# Patient Record
Sex: Male | Born: 1955 | Hispanic: No | Marital: Single | Smoking: Current every day smoker
Health system: Southern US, Community
[De-identification: ages and names within clinical notes are randomized; demographics above are authoritative.]

## PROBLEM LIST (undated history)

## (undated) DIAGNOSIS — I10 Essential (primary) hypertension: Secondary | ICD-10-CM

## (undated) DIAGNOSIS — E119 Type 2 diabetes mellitus without complications: Secondary | ICD-10-CM

## (undated) HISTORY — PX: CHOLECYSTECTOMY: SHX55

## (undated) HISTORY — PX: OTHER SURGICAL HISTORY: SHX169

---

## 2019-04-23 ENCOUNTER — Encounter (HOSPITAL_COMMUNITY): Payer: Self-pay

## 2019-04-23 ENCOUNTER — Emergency Department (HOSPITAL_COMMUNITY)
Admission: EM | Admit: 2019-04-23 | Discharge: 2019-04-23 | Disposition: A | Discharge status: Home / Self Care | Payer: Medicare PPO | Attending: Emergency Medicine | Admitting: Emergency Medicine

## 2019-04-23 DIAGNOSIS — F17210 Nicotine dependence, cigarettes, uncomplicated: Secondary | ICD-10-CM | POA: Insufficient documentation

## 2019-04-23 DIAGNOSIS — E1165 Type 2 diabetes mellitus with hyperglycemia: Secondary | ICD-10-CM | POA: Insufficient documentation

## 2019-04-23 DIAGNOSIS — R739 Hyperglycemia, unspecified: Secondary | ICD-10-CM

## 2019-04-23 HISTORY — DX: Type 2 diabetes mellitus without complications: E11.9

## 2019-04-23 LAB — CBG MONITORING, ED
Glucose-Capillary: 269 mg/dL — ABNORMAL HIGH (ref 70–99)
Glucose-Capillary: 296 mg/dL — ABNORMAL HIGH (ref 70–99)

## 2019-04-23 MED ORDER — ALOGLIPTIN BENZOATE 25 MG PO TABS: 12.5000 | ORAL_TABLET | Freq: Every day | ORAL | 0 refills | Status: DC

## 2019-04-23 MED ORDER — ALOGLIPTIN BENZOATE 25 MG PO TABS: 12.5000 mg | ORAL_TABLET | Freq: Every day | ORAL | 0 refills | Status: AC

## 2019-04-23 NOTE — Discharge Instructions (Signed)
Start back on your sugar pill.  Your blood sugar here is in the 200s.  Make sure you eat lunch.  If you start feeling dizzy, lightheaded or not well make sure you check your blood sugar.  [facility] does not arrive.

## 2019-04-23 NOTE — ED Notes (Addendum)
BLOOD SUGAR 269

## 2019-04-23 NOTE — ED Triage Notes (Addendum)
Arrived by EMS from assisted living [facility] hyperglycemia asymptomatic glucose 421. Patient A& OX4, ambulates w/o assistance. Patient reports he took Lantus last night and another dose of Lantus this morning because his blood sugar was elevated .

## 2019-04-23 NOTE — ED Provider Notes (Signed)
[facility]-EMERGENCY DEPT Provider Note   CSN: [id number] Arrival date & time: 04/23/19  1234    History   Chief Complaint Chief Complaint  Patient presents with  . Hyperglycemia    HPI Harvie HeckBradley Fullman is a 63 y.o. male.     The history is provided by the patient.  Hyperglycemia  Blood sugar level PTA:  >400 Severity:  Moderate Onset quality:  Gradual Duration:  4 days Timing:  Constant Progression:  Worsening Diabetes status:  Controlled with oral medications and controlled with insulin Current diabetic therapy:  Insulin 12units at night and 1/2 tab of agliptin Time since last antidiabetic medication: took 12units of insulin today at 6 am and then again at 10am when sugar was high. Context comment:  Ran out of pill 4-5 days ago and waiting for it to come in the mail Ineffective treatments:  None tried Associated symptoms: no abdominal pain, no altered mental status, no blurred vision, no chest pain, no confusion, no dehydration, no dizziness, no dysuria, no increased thirst, no malaise, no nausea, no polyuria, no shortness of breath, no vomiting, no weakness and no weight change   Risk factors: obesity   Risk factors comment:  DM   Past Medical History:  Diagnosis Date  . Diabetes mellitus without complication (HCC)     There are no active problems to display for this patient.   History reviewed. No pertinent surgical history.      Home Medications    Prior to Admission medications   Not on File    Family History History reviewed. No pertinent family history.  Social History Social History   Tobacco Use  . Smoking status: Current Every Day Smoker    Packs/day: 1.00    Types: Cigarettes  . Smokeless tobacco: Never Used  Substance Use Topics  . Alcohol use: Not Currently  . Drug use: Not on file     Allergies   Glipizide   Review of Systems Review of Systems  Eyes: Negative for blurred vision.  Respiratory: Negative  for shortness of breath.   Cardiovascular: Negative for chest pain.  Gastrointestinal: Negative for abdominal pain, nausea and vomiting.  Endocrine: Negative for polydipsia and polyuria.  Genitourinary: Negative for dysuria.  Neurological: Negative for dizziness and weakness.  Psychiatric/Behavioral: Negative for confusion.  All other systems reviewed and are negative.    Physical Exam Updated Vital Signs BP (!) 169/87 (BP Location: Left Arm)   Pulse 74   Temp 98 F (36.7 C) (Oral)   Resp 17   Ht 5\' 3"  (1.6 m)   Wt 122.5 kg   SpO2 99%   BMI 47.83 kg/m   Physical Exam Vitals signs and nursing note reviewed.  Constitutional:      General: He is not in acute distress.    Appearance: He is well-developed.  HENT:     Head: Normocephalic and atraumatic.  Eyes:     Conjunctiva/sclera: Conjunctivae normal.     Pupils: Pupils are equal, round, and reactive to light.  Neck:     Musculoskeletal: Normal range of motion and neck supple.  Cardiovascular:     Rate and Rhythm: Normal rate and regular rhythm.     Heart sounds: No murmur.  Pulmonary:     Effort: Pulmonary effort is normal. No respiratory distress.     Breath sounds: Normal breath sounds. No wheezing or rales.  Abdominal:     General: There is no distension.     Palpations: Abdomen is  soft.     Tenderness: There is no abdominal tenderness. There is no guarding or rebound.  Musculoskeletal: Normal range of motion.        General: No tenderness.     Comments: Well healing left great toe stump  Skin:    General: Skin is warm and dry.     Findings: No erythema or rash.  Neurological:     General: No focal deficit present.     Mental Status: He is alert and oriented to person, place, and time. Mental status is at baseline.  Psychiatric:        Mood and Affect: Mood normal.        Behavior: Behavior normal.        Thought Content: Thought content normal.      ED Treatments / Results  Labs (all labs ordered are  listed, but only abnormal results are displayed) Labs Reviewed  CBG MONITORING, ED - Abnormal; Notable for the following components:      Result Value   Glucose-Capillary 296 (*)    All other components within normal limits  CBG MONITORING, ED - Abnormal; Notable for the following components:   Glucose-Capillary 269 (*)    All other components within normal limits    EKG None  Radiology No results found.  Procedures Procedures (including critical care time)  Medications Ordered in ED Medications - No data to display   Initial Impression / Assessment and Plan / ED Course  I have reviewed the triage vital signs and the nursing notes.  Pertinent labs & imaging results that were available during my care of the patient were reviewed by me and considered in my medical decision making (see chart for details).       63 year old male presenting today because of hyperglycemia.  Patient has been out of his oral medication for the last 4 to 5 days and so today when he woke up and his sugar was elevated he took additional insulin 12 units around 6 AM.  He ate breakfast around 930 and it was still elevated so he took another dose of 12 units at 10:00.  Patient has no complaints at this time.  He is having no symptoms.  He checked his blood sugar it was 421.  Here patient's blood sugar is 290 and 269.  He has not eaten lunch yet.  Patient given a prescription for the medication he is out of and will return if he becomes symptomatic.  Final Clinical Impressions(s) / ED Diagnoses   Final diagnoses:  Hyperglycemia    ED Discharge Orders         Ordered    Alogliptin Benzoate 25 MG TABS  Daily     04/23/19 1341           Gwyneth Sprout, MD 04/23/19 1341

## 2019-04-23 NOTE — ED Notes (Signed)
Bed: IT25 Expected date:  Expected time:  Means of arrival:  Comments: 63 yo hyperglycemia

## 2019-07-23 ENCOUNTER — Emergency Department (HOSPITAL_COMMUNITY)
Admission: EM | Admit: 2019-07-23 | Discharge: 2019-07-23 | Disposition: A | Discharge status: Home / Self Care | Payer: Medicare PPO | Attending: Emergency Medicine | Admitting: Emergency Medicine

## 2019-07-23 ENCOUNTER — Encounter (HOSPITAL_COMMUNITY): Payer: Self-pay

## 2019-07-23 ENCOUNTER — Other Ambulatory Visit: Payer: Self-pay

## 2019-07-23 ENCOUNTER — Emergency Department (HOSPITAL_COMMUNITY): Payer: Medicare PPO

## 2019-07-23 DIAGNOSIS — F17210 Nicotine dependence, cigarettes, uncomplicated: Secondary | ICD-10-CM | POA: Insufficient documentation

## 2019-07-23 DIAGNOSIS — E119 Type 2 diabetes mellitus without complications: Secondary | ICD-10-CM | POA: Insufficient documentation

## 2019-07-23 DIAGNOSIS — B349 Viral infection, unspecified: Secondary | ICD-10-CM | POA: Diagnosis not present

## 2019-07-23 DIAGNOSIS — Z20828 Contact with and (suspected) exposure to other viral communicable diseases: Secondary | ICD-10-CM | POA: Diagnosis not present

## 2019-07-23 DIAGNOSIS — R0602 Shortness of breath: Secondary | ICD-10-CM | POA: Diagnosis present

## 2019-07-23 LAB — BRAIN NATRIURETIC PEPTIDE: B Natriuretic Peptide: 219.4 pg/mL — ABNORMAL HIGH (ref 0.0–100.0)

## 2019-07-23 LAB — BASIC METABOLIC PANEL
Anion gap: 8 (ref 5–15)
BUN: 26 mg/dL — ABNORMAL HIGH (ref 8–23)
CO2: 26 mmol/L (ref 22–32)
Calcium: 8.6 mg/dL — ABNORMAL LOW (ref 8.9–10.3)
Chloride: 97 mmol/L — ABNORMAL LOW (ref 98–111)
Creatinine, Ser: 1.84 mg/dL — ABNORMAL HIGH (ref 0.61–1.24)
GFR calc Af Amer: 44 mL/min — ABNORMAL LOW (ref >60)
GFR calc non Af Amer: 38 mL/min — ABNORMAL LOW (ref >60)
Glucose, Bld: 299 mg/dL — ABNORMAL HIGH (ref 70–99)
Potassium: 4.4 mmol/L (ref 3.5–5.1)
Sodium: 131 mmol/L — ABNORMAL LOW (ref 135–145)

## 2019-07-23 LAB — SARS CORONAVIRUS 2 (TAT 6-24 HRS): SARS Coronavirus 2: NEGATIVE

## 2019-07-23 MED ORDER — IBUPROFEN 200 MG PO TABS
400.0000 mg | ORAL_TABLET | Freq: Once | ORAL | Status: AC
  Administered 2019-07-23: 400 mg via ORAL
  Filled 2019-07-23: qty 2

## 2019-07-23 MED ORDER — BENZONATATE 100 MG PO CAPS
100.0000 mg | ORAL_CAPSULE | Freq: Once | ORAL | Status: AC
  Administered 2019-07-23: 14:00:00 100 mg via ORAL
  Filled 2019-07-23: qty 1

## 2019-07-23 MED ORDER — ACETAMINOPHEN 325 MG PO TABS
650.0000 mg | ORAL_TABLET | Freq: Once | ORAL | Status: DC
  Filled 2019-07-23: qty 2

## 2019-07-23 MED ORDER — ACETAMINOPHEN 500 MG PO TABS: 500.0000 mg | ORAL_TABLET | Freq: Four times a day (QID) | ORAL | 0 refills | Status: AC | PRN

## 2019-07-23 MED ORDER — BENZONATATE 100 MG PO CAPS: 100.0000 mg | ORAL_CAPSULE | Freq: Three times a day (TID) | ORAL | 0 refills | Status: AC

## 2019-07-23 MED FILL — BENZONATATE 100 MG CAPS: 100 | 7 days supply | Qty: 21 | Fill #0

## 2019-07-23 NOTE — ED Triage Notes (Signed)
EMS from homeless shelter, called out for SOB, headache, cough x 4 days. Facility sent Pt for testing due to requires negative Covid test for placement.  BP 124/9*0 HR 86 RR 16 Sp02 97 RA Temp 98.1

## 2019-07-23 NOTE — Discharge Instructions (Signed)
You have been screened for COVID-19.  Your result will be available in the next 24 hrs.  If tested positive, we will contact you.  Take tylenol as needed for pain.  Avoid advil or ibuprofen at this time as your kidney function is less than optimal.  Return to the ER if your condition worsen or if you have other concerns.

## 2019-07-23 NOTE — ED Provider Notes (Signed)
[facility]-EMERGENCY DEPT Provider Note   CSN: [id number] Arrival date & time: 07/23/19  1022     History   Chief Complaint Chief Complaint  Patient presents with  . Shortness of Breath  . Cough  . Headache    HPI Darius Bond is a 63 y.o. male.     The history is provided by the patient and medical records. No language interpreter was used.  Shortness of Breath Associated symptoms: cough and headaches   Cough Associated symptoms: headaches and shortness of breath   Headache Associated symptoms: cough      63 year old male with history of diabetes, tobacco abuse, homeless, brought here via EMS from homeless shelter for complaints of cough.  Patient report for the past 4 days he has had persistent cough occasionally productive with sputum, having throbbing headache, pain to his low back down to his legs, not feeling well.  He does not complain of any fever or chills, no runny nose sneezing sore throat.  He does complain of some shortness of breath but denies exertional chest pain.  No report of nausea vomiting diarrhea or abdominal pain.  No dysuria.  He denies any recent sick contact with anyone with COVID-19.  The facility want patient to be tested for COVID-19 in order for placement.  Patient denies any specific treatment tried at home.  He admits to tobacco use and occasional alcohol use.  No recent travel.  Past Medical History:  Diagnosis Date  . Diabetes mellitus without complication (HCC)     There are no active problems to display for this patient.   History reviewed. No pertinent surgical history.      Home Medications    Prior to Admission medications   Medication Sig Start Date End Date Taking? Authorizing Provider  Alogliptin Benzoate 25 MG TABS Take 12.5 mg by mouth daily. 04/23/19   Gwyneth SproutPlunkett, Whitney, MD    Family History History reviewed. No pertinent family history.  Social History Social History   Tobacco Use  .  Smoking status: Current Every Day Smoker    Packs/day: 1.00    Types: Cigarettes  . Smokeless tobacco: Never Used  Substance Use Topics  . Alcohol use: Not Currently  . Drug use: Not on file     Allergies   Glipizide   Review of Systems Review of Systems  Respiratory: Positive for cough and shortness of breath.   Neurological: Positive for headaches.  All other systems reviewed and are negative.    Physical Exam Updated Vital Signs BP (!) 146/81   Pulse 76   Temp 98.3 F (36.8 C) (Oral)   Resp 16   Ht 5\' 3"  (1.6 m)   Wt 120.2 kg   SpO2 99%   BMI 46.94 kg/m   Physical Exam Vitals signs and nursing note reviewed.  Constitutional:      General: He is not in acute distress.    Appearance: He is well-developed. He is obese.  HENT:     Head: Normocephalic and atraumatic.  Eyes:     Conjunctiva/sclera: Conjunctivae normal.  Neck:     Musculoskeletal: Normal range of motion and neck supple.     Comments: No nuchal rigidity Cardiovascular:     Rate and Rhythm: Normal rate and regular rhythm.     Pulses: Normal pulses.     Heart sounds: Normal heart sounds.  Pulmonary:     Effort: Pulmonary effort is normal.     Breath sounds: Normal breath sounds. No  wheezing, rhonchi or rales.  Abdominal:     Palpations: Abdomen is soft.     Tenderness: There is no abdominal tenderness.  Musculoskeletal:        General: Tenderness (Tenderness to lumbar and paralumbar spinal muscle on palpation negative straight leg raise.) present. No swelling.  Lymphadenopathy:     Cervical: No cervical adenopathy.  Skin:    Capillary Refill: Capillary refill takes less than 2 seconds.     Findings: No rash.  Neurological:     Mental Status: He is alert and oriented to person, place, and time.  Psychiatric:        Mood and Affect: Mood normal.      ED Treatments / Results  Labs (all labs ordered are listed, but only abnormal results are displayed) Labs Reviewed  BASIC METABOLIC  PANEL - Abnormal; Notable for the following components:      Result Value   Sodium 131 (*)    Chloride 97 (*)    Glucose, Bld 299 (*)    BUN 26 (*)    Creatinine, Ser 1.84 (*)    Calcium 8.6 (*)    GFR calc non Af Amer 38 (*)    GFR calc Af Amer 44 (*)    All other components within normal limits  BRAIN NATRIURETIC PEPTIDE - Abnormal; Notable for the following components:   B Natriuretic Peptide 219.4 (*)    All other components within normal limits  SARS CORONAVIRUS 2    EKG EKG Interpretation  Date/Time:  Friday July 23 2019 11:19:05 EDT Ventricular Rate:  77 PR Interval:    QRS Duration: 80 QT Interval:  388 QTC Calculation: 440 R Axis:   -54 Text Interpretation:  Sinus rhythm Prolonged PR interval Abnormal R-wave progression, late transition Inferior infarct, old Baseline wander in lead(s) V2 Abnormal ECG Confirmed by Gerhard MunchLockwood, Robert [phone]([id number]) on 07/23/2019 11:36:24 AM   Radiology Dg Chest Portable 1 View  Result Date: 07/23/2019 CLINICAL DATA:  EMS from homeless shelter, called out for SOB, headache, productive cough (brown and yellow sputum) x 4 days. Facility sent Pt for testing. Requires negative Covid test for placement. Pt has HTN. DM. Current smoker since age 63.*comment was truncated*cough EXAM: PORTABLE CHEST 1 VIEW COMPARISON:  None. FINDINGS: Normal mediastinum and cardiac silhouette. Normal pulmonary vasculature. No evidence of effusion, infiltrate, or pneumothorax. No acute bony abnormality. IMPRESSION: No acute cardiopulmonary process. Electronically Signed   By: Genevive BiStewart  Edmunds M.D.   On: 07/23/2019 11:42    Procedures Procedures (including critical care time)  Medications Ordered in ED Medications  benzonatate (TESSALON) capsule 100 mg (100 mg Oral Given 07/23/19 1335)  ibuprofen (ADVIL) tablet 400 mg (400 mg Oral Given 07/23/19 1335)     Initial Impression / Assessment and Plan / ED Course  I have reviewed the triage vital signs and the nursing  notes.  Pertinent labs & imaging results that were available during my care of the patient were reviewed by me and considered in my medical decision making (see chart for details).        BP (!) 156/83   Pulse 80   Temp 98.3 F (36.8 C) (Oral)   Resp (!) 26   Ht 5\' 3"  (1.6 m)   Wt 120.2 kg   SpO2 97%   BMI 46.94 kg/m    Final Clinical Impressions(s) / ED Diagnoses   Final diagnoses:  Viral illness    ED Discharge Orders         Ordered  benzonatate (TESSALON) 100 MG capsule  Every 8 hours     07/23/19 1416    acetaminophen (TYLENOL) 500 MG tablet  Every 6 hours PRN     07/23/19 1416         10:49 AM Patient here with cough shortness of breath, headache and body aches.  He is homeless and currently staying at home with seltzer.  He was sent here to be screened for COVID-19 for placement.  Patient otherwise well-appearing, no hypoxia, afebrile, vital signs stable.  His lung exam is unremarkable.  Will obtain screening labs.  12:42 PM Chest x-ray normal-appearing.  Elevated BNP of 219 however patient does not have any signs of overt CHF.  Evidence of AKI with BUN 26, creatinine 1.84 no prior values for comparison.  Patient is also hyperglycemic with a CBG of 299.  Normal anion gap.  COVID-19 test is currently pending.  Gaylord Seydel was evaluated in Emergency Department on 07/23/2019 for the symptoms described in the history of present illness. He was evaluated in the context of the global COVID-19 pandemic, which necessitated consideration that the patient might be at risk for infection with the SARS-CoV-2 virus that causes COVID-19. Institutional protocols and algorithms that pertain to the evaluation of patients at risk for COVID-19 are in a state of rapid change based on information released by regulatory bodies including the CDC and federal and state organizations. These policies and algorithms were followed during the patient's care in the ED.    Domenic Moras,  PA-C 07/23/19 1418    Carmin Muskrat, MD 07/26/19 [phone]

## 2019-07-28 ENCOUNTER — Emergency Department (HOSPITAL_COMMUNITY)
Admission: EM | Admit: 2019-07-28 | Discharge: 2019-07-28 | Disposition: A | Discharge status: Home / Self Care | Payer: Medicare PPO | Source: Home / Self Care | Attending: Emergency Medicine | Admitting: Emergency Medicine

## 2019-07-28 ENCOUNTER — Encounter (HOSPITAL_COMMUNITY): Payer: Self-pay

## 2019-07-28 ENCOUNTER — Inpatient Hospital Stay (HOSPITAL_COMMUNITY)
Admission: EM | Admit: 2019-07-28 | Discharge: 2019-08-01 | DRG: 617 | Disposition: A | Discharge status: Home / Self Care | Payer: Medicare PPO | Attending: Family Medicine | Admitting: Family Medicine

## 2019-07-28 ENCOUNTER — Other Ambulatory Visit: Payer: Self-pay

## 2019-07-28 ENCOUNTER — Emergency Department (HOSPITAL_COMMUNITY): Payer: Medicare PPO

## 2019-07-28 DIAGNOSIS — Z79899 Other long term (current) drug therapy: Secondary | ICD-10-CM | POA: Diagnosis not present

## 2019-07-28 DIAGNOSIS — E8881 Metabolic syndrome: Secondary | ICD-10-CM | POA: Diagnosis present

## 2019-07-28 DIAGNOSIS — I2510 Atherosclerotic heart disease of native coronary artery without angina pectoris: Secondary | ICD-10-CM | POA: Diagnosis present

## 2019-07-28 DIAGNOSIS — I440 Atrioventricular block, first degree: Secondary | ICD-10-CM | POA: Diagnosis present

## 2019-07-28 DIAGNOSIS — E1165 Type 2 diabetes mellitus with hyperglycemia: Secondary | ICD-10-CM | POA: Diagnosis present

## 2019-07-28 DIAGNOSIS — I10 Essential (primary) hypertension: Secondary | ICD-10-CM | POA: Diagnosis present

## 2019-07-28 DIAGNOSIS — E1140 Type 2 diabetes mellitus with diabetic neuropathy, unspecified: Secondary | ICD-10-CM | POA: Diagnosis present

## 2019-07-28 DIAGNOSIS — Z888 Allergy status to other drugs, medicaments and biological substances status: Secondary | ICD-10-CM

## 2019-07-28 DIAGNOSIS — I129 Hypertensive chronic kidney disease with stage 1 through stage 4 chronic kidney disease, or unspecified chronic kidney disease: Secondary | ICD-10-CM | POA: Diagnosis present

## 2019-07-28 DIAGNOSIS — Z6841 Body Mass Index (BMI) 40.0 and over, adult: Secondary | ICD-10-CM | POA: Diagnosis not present

## 2019-07-28 DIAGNOSIS — N183 Chronic kidney disease, stage 3 unspecified: Secondary | ICD-10-CM

## 2019-07-28 DIAGNOSIS — M86172 Other acute osteomyelitis, left ankle and foot: Secondary | ICD-10-CM | POA: Diagnosis present

## 2019-07-28 DIAGNOSIS — Z7982 Long term (current) use of aspirin: Secondary | ICD-10-CM

## 2019-07-28 DIAGNOSIS — E1169 Type 2 diabetes mellitus with other specified complication: Principal | ICD-10-CM | POA: Diagnosis present

## 2019-07-28 DIAGNOSIS — G4730 Sleep apnea, unspecified: Secondary | ICD-10-CM | POA: Diagnosis present

## 2019-07-28 DIAGNOSIS — Z794 Long term (current) use of insulin: Secondary | ICD-10-CM

## 2019-07-28 DIAGNOSIS — E1142 Type 2 diabetes mellitus with diabetic polyneuropathy: Secondary | ICD-10-CM | POA: Diagnosis present

## 2019-07-28 DIAGNOSIS — N179 Acute kidney failure, unspecified: Secondary | ICD-10-CM | POA: Diagnosis present

## 2019-07-28 DIAGNOSIS — M8618 Other acute osteomyelitis, other site: Secondary | ICD-10-CM

## 2019-07-28 DIAGNOSIS — E11621 Type 2 diabetes mellitus with foot ulcer: Secondary | ICD-10-CM | POA: Diagnosis present

## 2019-07-28 DIAGNOSIS — Z20828 Contact with and (suspected) exposure to other viral communicable diseases: Secondary | ICD-10-CM | POA: Diagnosis present

## 2019-07-28 DIAGNOSIS — E1122 Type 2 diabetes mellitus with diabetic chronic kidney disease: Secondary | ICD-10-CM | POA: Diagnosis present

## 2019-07-28 DIAGNOSIS — Z590 Homelessness: Secondary | ICD-10-CM

## 2019-07-28 DIAGNOSIS — E1121 Type 2 diabetes mellitus with diabetic nephropathy: Secondary | ICD-10-CM | POA: Diagnosis present

## 2019-07-28 DIAGNOSIS — F17210 Nicotine dependence, cigarettes, uncomplicated: Secondary | ICD-10-CM | POA: Diagnosis present

## 2019-07-28 DIAGNOSIS — M7989 Other specified soft tissue disorders: Secondary | ICD-10-CM

## 2019-07-28 DIAGNOSIS — I739 Peripheral vascular disease, unspecified: Secondary | ICD-10-CM | POA: Diagnosis present

## 2019-07-28 DIAGNOSIS — M869 Osteomyelitis, unspecified: Secondary | ICD-10-CM | POA: Diagnosis present

## 2019-07-28 DIAGNOSIS — E1151 Type 2 diabetes mellitus with diabetic peripheral angiopathy without gangrene: Secondary | ICD-10-CM | POA: Diagnosis present

## 2019-07-28 DIAGNOSIS — M79675 Pain in left toe(s): Secondary | ICD-10-CM | POA: Diagnosis present

## 2019-07-28 DIAGNOSIS — R0609 Other forms of dyspnea: Secondary | ICD-10-CM | POA: Diagnosis not present

## 2019-07-28 HISTORY — DX: Essential (primary) hypertension: I10

## 2019-07-28 LAB — CBC WITH DIFFERENTIAL/PLATELET
Abs Immature Granulocytes: 0.21 10*3/uL — ABNORMAL HIGH (ref 0.00–0.07)
Basophils Absolute: 0 10*3/uL (ref 0.0–0.1)
Basophils Relative: 0 %
Eosinophils Absolute: 1 10*3/uL — ABNORMAL HIGH (ref 0.0–0.5)
Eosinophils Relative: 7 %
HCT: 32.8 % — ABNORMAL LOW (ref 39.0–52.0)
Hemoglobin: 11.1 g/dL — ABNORMAL LOW (ref 13.0–17.0)
Immature Granulocytes: 2 %
Lymphocytes Relative: 12 %
Lymphs Abs: 1.7 10*3/uL (ref 0.7–4.0)
MCH: 27 pg (ref 26.0–34.0)
MCHC: 33.8 g/dL (ref 30.0–36.0)
MCV: 79.8 fL — ABNORMAL LOW (ref 80.0–100.0)
Monocytes Absolute: 1.5 10*3/uL — ABNORMAL HIGH (ref 0.1–1.0)
Monocytes Relative: 11 %
Neutro Abs: 9.7 10*3/uL — ABNORMAL HIGH (ref 1.7–7.7)
Neutrophils Relative %: 68 %
Platelets: 285 10*3/uL (ref 150–400)
RBC: 4.11 MIL/uL — ABNORMAL LOW (ref 4.22–5.81)
RDW: 13.6 % (ref 11.5–15.5)
WBC: 14.1 10*3/uL — ABNORMAL HIGH (ref 4.0–10.5)
nRBC: 0 % (ref 0.0–0.2)

## 2019-07-28 LAB — SARS CORONAVIRUS 2 (TAT 6-24 HRS): SARS Coronavirus 2: NEGATIVE

## 2019-07-28 LAB — BASIC METABOLIC PANEL
Anion gap: 9 (ref 5–15)
BUN: 26 mg/dL — ABNORMAL HIGH (ref 8–23)
CO2: 22 mmol/L (ref 22–32)
Calcium: 8.3 mg/dL — ABNORMAL LOW (ref 8.9–10.3)
Chloride: 100 mmol/L (ref 98–111)
Creatinine, Ser: 1.64 mg/dL — ABNORMAL HIGH (ref 0.61–1.24)
GFR calc Af Amer: 50 mL/min — ABNORMAL LOW (ref >60)
GFR calc non Af Amer: 44 mL/min — ABNORMAL LOW (ref >60)
Glucose, Bld: 274 mg/dL — ABNORMAL HIGH (ref 70–99)
Potassium: 4.4 mmol/L (ref 3.5–5.1)
Sodium: 131 mmol/L — ABNORMAL LOW (ref 135–145)

## 2019-07-28 LAB — LACTIC ACID, PLASMA: Lactic Acid, Venous: 0.8 mmol/L (ref 0.5–1.9)

## 2019-07-28 LAB — GLUCOSE, CAPILLARY: Glucose-Capillary: 272 mg/dL — ABNORMAL HIGH (ref 70–99)

## 2019-07-28 MED ORDER — METHOCARBAMOL 500 MG PO TABS
750.0000 mg | ORAL_TABLET | Freq: Two times a day (BID) | ORAL | Status: DC | PRN
  Administered 2019-07-28 – 2019-07-30 (×3): 750 mg via ORAL
  Filled 2019-07-28 (×3): qty 2

## 2019-07-28 MED ORDER — ENOXAPARIN SODIUM 40 MG/0.4ML ~~LOC~~ SOLN
40.0000 mg | SUBCUTANEOUS | Status: DC
  Administered 2019-07-28 – 2019-07-31 (×4): 40 mg via SUBCUTANEOUS
  Filled 2019-07-28 (×4): qty 0.4

## 2019-07-28 MED ORDER — FLUOXETINE HCL 20 MG PO CAPS
60.0000 mg | ORAL_CAPSULE | Freq: Every day | ORAL | Status: DC
  Administered 2019-07-29 – 2019-08-01 (×4): 60 mg via ORAL
  Filled 2019-07-28 (×4): qty 3

## 2019-07-28 MED ORDER — SIMVASTATIN 20 MG PO TABS
20.0000 mg | ORAL_TABLET | Freq: Every day | ORAL | Status: DC
  Administered 2019-07-29 – 2019-08-01 (×4): 20 mg via ORAL
  Filled 2019-07-28 (×4): qty 1

## 2019-07-28 MED ORDER — ONDANSETRON HCL 4 MG/2ML IJ SOLN
4.0000 mg | Freq: Once | INTRAMUSCULAR | Status: AC
  Administered 2019-07-28: 14:00:00 4 mg via INTRAVENOUS
  Filled 2019-07-28: qty 2

## 2019-07-28 MED ORDER — TAMSULOSIN HCL 0.4 MG PO CAPS
0.4000 mg | ORAL_CAPSULE | Freq: Every day | ORAL | Status: DC
  Administered 2019-07-28 – 2019-07-31 (×4): 0.4 mg via ORAL
  Filled 2019-07-28 (×4): qty 1

## 2019-07-28 MED ORDER — VANCOMYCIN HCL 10 G IV SOLR
2000.0000 mg | Freq: Once | INTRAVENOUS | Status: AC
  Administered 2019-07-28: 2000 mg via INTRAVENOUS
  Filled 2019-07-28: qty 2000

## 2019-07-28 MED ORDER — TRAZODONE HCL 100 MG PO TABS
200.0000 mg | ORAL_TABLET | Freq: Every day | ORAL | Status: DC
  Administered 2019-07-28 – 2019-07-31 (×4): 200 mg via ORAL
  Filled 2019-07-28 (×4): qty 2

## 2019-07-28 MED ORDER — PIPERACILLIN-TAZOBACTAM 3.375 G IVPB 30 MIN
3.3750 g | Freq: Once | INTRAVENOUS | Status: AC
  Administered 2019-07-28: 14:00:00 3.375 g via INTRAVENOUS
  Filled 2019-07-28: qty 50

## 2019-07-28 MED ORDER — MORPHINE SULFATE (PF) 4 MG/ML IV SOLN
4.0000 mg | Freq: Once | INTRAVENOUS | Status: AC
  Administered 2019-07-28: 4 mg via INTRAVENOUS
  Filled 2019-07-28: qty 1

## 2019-07-28 MED ORDER — INSULIN ASPART 100 UNIT/ML ~~LOC~~ SOLN
0.0000 [IU] | Freq: Three times a day (TID) | SUBCUTANEOUS | Status: DC
  Administered 2019-07-29: 10:00:00 5 [IU] via SUBCUTANEOUS
  Administered 2019-07-29 – 2019-07-30 (×2): 1 [IU] via SUBCUTANEOUS
  Administered 2019-07-31 – 2019-08-01 (×4): 2 [IU] via SUBCUTANEOUS

## 2019-07-28 MED ORDER — ALOGLIPTIN BENZOATE 25 MG PO TABS: 12.5000 mg | ORAL_TABLET | Freq: Every day | ORAL | Status: DC

## 2019-07-28 MED ORDER — VANCOMYCIN HCL IN DEXTROSE 1-5 GM/200ML-% IV SOLN
1000.0000 mg | INTRAVENOUS | Status: DC
  Administered 2019-07-29 – 2019-07-31 (×2): 1000 mg via INTRAVENOUS
  Filled 2019-07-28 (×5): qty 200

## 2019-07-28 MED ORDER — PIPERACILLIN-TAZOBACTAM 3.375 G IVPB
3.3750 g | Freq: Three times a day (TID) | INTRAVENOUS | Status: DC
  Administered 2019-07-28 – 2019-08-01 (×11): 3.375 g via INTRAVENOUS
  Filled 2019-07-28 (×13): qty 50

## 2019-07-28 MED ORDER — ASPIRIN 81 MG PO CHEW
81.0000 mg | CHEWABLE_TABLET | Freq: Every day | ORAL | Status: DC
  Administered 2019-07-29 – 2019-08-01 (×3): 81 mg via ORAL
  Filled 2019-07-28 (×3): qty 1

## 2019-07-28 MED ORDER — LINAGLIPTIN 5 MG PO TABS
5.0000 mg | ORAL_TABLET | Freq: Every day | ORAL | Status: DC
  Administered 2019-07-29 – 2019-08-01 (×4): 5 mg via ORAL
  Filled 2019-07-28 (×3): qty 1

## 2019-07-28 MED ORDER — POLYVINYL ALCOHOL 1.4 % OP SOLN: 1.0000 [drp] | OPHTHALMIC | Status: DC | PRN

## 2019-07-28 MED ORDER — SODIUM CHLORIDE 0.9 % IV SOLN
INTRAVENOUS | Status: DC
  Administered 2019-07-28 – 2019-07-30 (×2): via INTRAVENOUS

## 2019-07-28 MED ORDER — BENZONATATE 100 MG PO CAPS
100.0000 mg | ORAL_CAPSULE | Freq: Three times a day (TID) | ORAL | Status: DC | PRN
  Administered 2019-07-30: 08:00:00 100 mg via ORAL
  Filled 2019-07-28: qty 1

## 2019-07-28 MED ORDER — INSULIN ASPART 100 UNIT/ML ~~LOC~~ SOLN
0.0000 [IU] | Freq: Every day | SUBCUTANEOUS | Status: DC
  Administered 2019-07-28: 23:00:00 3 [IU] via SUBCUTANEOUS

## 2019-07-28 MED ORDER — ACETAMINOPHEN 500 MG PO TABS
500.0000 mg | ORAL_TABLET | Freq: Four times a day (QID) | ORAL | Status: DC | PRN
  Administered 2019-07-28 – 2019-07-30 (×4): 500 mg via ORAL
  Filled 2019-07-28 (×4): qty 1

## 2019-07-28 MED ORDER — INSULIN GLARGINE 100 UNIT/ML ~~LOC~~ SOLN
12.0000 [IU] | Freq: Every day | SUBCUTANEOUS | Status: DC
  Administered 2019-07-28: 23:00:00 12 [IU] via SUBCUTANEOUS
  Filled 2019-07-28: qty 0.12

## 2019-07-28 MED ORDER — LATANOPROST 0.005 % OP SOLN
1.0000 [drp] | Freq: Every day | OPHTHALMIC | Status: DC
  Administered 2019-07-28 – 2019-07-31 (×4): 1 [drp] via OPHTHALMIC
  Filled 2019-07-28: qty 2.5

## 2019-07-28 MED ORDER — HYDROXYZINE HCL 25 MG PO TABS
25.0000 mg | ORAL_TABLET | Freq: Two times a day (BID) | ORAL | Status: DC
  Administered 2019-07-28 – 2019-08-01 (×8): 25 mg via ORAL
  Filled 2019-07-28 (×8): qty 1

## 2019-07-28 MED ORDER — CARVEDILOL 12.5 MG PO TABS
12.5000 mg | ORAL_TABLET | Freq: Two times a day (BID) | ORAL | Status: DC
  Administered 2019-07-28 – 2019-08-01 (×8): 12.5 mg via ORAL
  Filled 2019-07-28 (×8): qty 1

## 2019-07-28 MED ORDER — AMLODIPINE BESYLATE 5 MG PO TABS
5.0000 mg | ORAL_TABLET | Freq: Every day | ORAL | Status: DC
  Administered 2019-07-29 – 2019-07-30 (×2): 5 mg via ORAL
  Filled 2019-07-28 (×2): qty 1

## 2019-07-28 MED ORDER — MELATONIN 3 MG PO TABS
6.0000 mg | ORAL_TABLET | Freq: Every day | ORAL | Status: DC
  Administered 2019-07-28 – 2019-07-31 (×4): 6 mg via ORAL
  Filled 2019-07-28 (×5): qty 2

## 2019-07-28 NOTE — Progress Notes (Signed)
Left lower extremity venous duplex has been completed. Preliminary results can be found in CV Proc through chart review.  Results were given to Dr. Gilford Raid.   07/28/19 3:23 PM Darius Bond RVT

## 2019-07-28 NOTE — ED Notes (Signed)
ED TO INPATIENT HANDOFF REPORT  Name/Age/Gender Darius Bond 63 y.o. male  Code Status   Home/SNF/Other Home  Chief Complaint skin infection  Level of Care/Admitting Diagnosis ED Disposition    ED Disposition Condition Comment   Admit  [facility] [[id number]]  Level of Care: Med-Surg [16]  Covid Evaluation: Asymptomatic Screening Protocol (No Symptoms)  Diagnosis: Osteomyelitis (HCC) [161096][242991]  Admitting Physician: Kathlen ModyAKULA, VIJAYA [[id number]]  Attending Physician: Kathlen ModyAKULA, VIJAYA [[id number]]  Estimated length of stay: past midnight tomorrow  Certification:: I certify this patient will need inpatient services for at least 2 midnights  PT Class (Do Not Modify): Inpatient [101]  PT Acc Code (Do Not Modify): Private [1]       Medical History Past Medical History:  Diagnosis Date  . Diabetes mellitus without complication (HCC)   . Hypertension     Allergies Allergies  Allergen Reactions  . Glipizide Swelling    Tongue swelling    IV Location/Drains/Wounds Patient Lines/Drains/Airways Status   Active Line/Drains/Airways    Name:   Placement date:   Placement time:   Site:   Days:   Peripheral IV 07/28/19 Left Forearm   07/28/19    1357    Forearm   less than 1          Labs/Imaging Results for orders placed or performed during the hospital encounter of 07/28/19 (from the past 48 hour(s))  Basic metabolic panel     Status: Abnormal   Collection Time: 07/28/19  1:25 PM  Result Value Ref Range   Sodium 131 (L) 135 - 145 mmol/L   Potassium 4.4 3.5 - 5.1 mmol/L   Chloride 100 98 - 111 mmol/L   CO2 22 22 - 32 mmol/L   Glucose, Bld 274 (H) 70 - 99 mg/dL   BUN 26 (H) 8 - 23 mg/dL   Creatinine, Ser 0.451.64 (H) 0.61 - 1.24 mg/dL   Calcium 8.3 (L) 8.9 - 10.3 mg/dL   GFR calc non Af Amer 44 (L) >60 mL/min   GFR calc Af Amer 50 (L) >60 mL/min   Anion gap 9 5 - 15    Comment: Performed at [facility], [address]  [place], [place] [address]  CBC with Differential/Platelet     Status: Abnormal (Preliminary result)   Collection Time: 07/28/19  1:25 PM  Result Value Ref Range   WBC 14.1 (H) 4.0 - 10.5 K/uL   RBC 4.11 (L) 4.22 - 5.81 MIL/uL   Hemoglobin 11.1 (L) 13.0 - 17.0 g/dL   HCT 19.132.8 (L) 47.839.0 - 29.552.0 %   MCV 79.8 (L) 80.0 - 100.0 fL   MCH 27.0 26.0 - 34.0 pg   MCHC 33.8 30.0 - 36.0 g/dL   RDW 62.113.6 30.811.5 - 65.715.5 %   Platelets 285 150 - 400 K/uL   nRBC 0.0 0.0 - 0.2 %    Comment: Performed at [facility], [address] [place], [place] [address]   Neutrophils Relative % PENDING %   Neutro Abs PENDING 1.7 - 7.7 K/uL   Band Neutrophils PENDING %   Lymphocytes Relative PENDING %   Lymphs Abs PENDING 0.7 - 4.0 K/uL   Monocytes Relative PENDING %   Monocytes Absolute PENDING 0.1 - 1.0 K/uL   Eosinophils Relative PENDING %   Eosinophils Absolute PENDING 0.0 - 0.5 K/uL   Basophils Relative PENDING %   Basophils Absolute PENDING 0.0 - 0.1 K/uL   WBC Morphology PENDING  RBC Morphology PENDING    Smear Review PENDING    Other PENDING %   nRBC PENDING 0 /100 WBC   Metamyelocytes Relative PENDING %   Myelocytes PENDING %   Promyelocytes Relative PENDING %   Blasts PENDING %  Lactic acid, plasma     Status: None   Collection Time: 07/28/19  1:25 PM  Result Value Ref Range   Lactic Acid, Venous 0.8 0.5 - 1.9 mmol/L    Comment: Performed at [facility], [address] [place], [place] [address]   Dg Foot Complete Left  Result Date: 07/28/2019 CLINICAL DATA:  Amputation earlier this year. Diabetes. Question second toe osteomyelitis. EXAM: LEFT FOOT - COMPLETE 3+ VIEW COMPARISON:  None. FINDINGS: Previous partial amputation of the great toe with residual proximal phalanx. That bone is slightly irregular and indeterminate for residual infection. There is air/gas in the soft tissues of the second toe with erosion of the distal phalanx consistent with osteomyelitis.  Extensive regional arterial calcification is noted. Generalized soft tissue swelling of the foot. IMPRESSION: Osteomyelitis of the second toe with partial erosion of the distal phalanx. Previous amputation of the great toe. Distal bone margin of the phalanx is somewhat irregular which could be normal or reflect residual or recurrent infection in that bone. Electronically Signed   By: Paulina FusiMark  Shogry M.D.   On: 07/28/2019 14:40   Vas [place] Lower Extremity Venous (dvt) 7a-7p  Result Date: 07/28/2019  Lower Venous Study Indications: Swelling.  Risk Factors: None identified. Comparison Study: No prior studies. Performing Technologist: Chanda BusingGregory Collins RVT  Examination Guidelines: A complete evaluation includes B-mode imaging, spectral Doppler, color Doppler, and power Doppler as needed of all accessible portions of each vessel. Bilateral testing is considered an integral part of a complete examination. Limited examinations for reoccurring indications may be performed as noted.  +-----+---------------+---------+-----------+----------+--------------+ RIGHTCompressibilityPhasicitySpontaneityPropertiesThrombus Aging +-----+---------------+---------+-----------+----------+--------------+ CFV  Full           Yes      Yes                                 +-----+---------------+---------+-----------+----------+--------------+   +---------+---------------+---------+-----------+----------+--------------+ LEFT     CompressibilityPhasicitySpontaneityPropertiesThrombus Aging +---------+---------------+---------+-----------+----------+--------------+ CFV      Full           Yes      Yes                                 +---------+---------------+---------+-----------+----------+--------------+ SFJ      Full                                                        +---------+---------------+---------+-----------+----------+--------------+ FV Prox  Full                                                         +---------+---------------+---------+-----------+----------+--------------+ FV Mid   Full                                                        +---------+---------------+---------+-----------+----------+--------------+  FV DistalFull                                                        +---------+---------------+---------+-----------+----------+--------------+ PFV      Full                                                        +---------+---------------+---------+-----------+----------+--------------+ POP      Full           Yes      Yes                                 +---------+---------------+---------+-----------+----------+--------------+ PTV      Full                                                        +---------+---------------+---------+-----------+----------+--------------+ PERO     Full                                                        +---------+---------------+---------+-----------+----------+--------------+     Summary: Right: No evidence of common femoral vein obstruction. Left: There is no evidence of deep vein thrombosis in the lower extremity. No cystic structure found in the popliteal fossa.  *See table(s) above for measurements and observations.    Preliminary     Pending Labs Unresulted Labs (From admission, onward)    Start     Ordered   07/28/19 1327  SARS CORONAVIRUS 2 (TAT 6-12 HRS) Nasal Swab Aptima Multi Swab  (Asymptomatic/Tier 2 Patients Labs)  Once,   STAT    Question Answer Comment  Is this test for diagnosis or screening Screening   Symptomatic for COVID-19 as defined by CDC No   Hospitalized for COVID-19 No   Admitted to ICU for COVID-19 No   Previously tested for COVID-19 No   Resident in a congregate (group) care setting No   Employed in healthcare setting No      07/28/19 1326   07/28/19 1325  Culture, blood (routine x 2)  BLOOD CULTURE X 2,   STAT     07/28/19 1325          Vitals/Pain Today's  Vitals   07/28/19 1324 07/28/19 1500 07/28/19 1530 07/28/19 1600  BP:  129/79 (!) 141/79 (!) 150/74  Pulse:  69 66 65  Resp: 20 17 16 17   Temp:      TempSrc:      SpO2:  100% 100% 97%  Weight:      Height:        Isolation Precautions No active isolations  Medications Medications  0.9 %  sodium chloride infusion ( Intravenous New Bag/Given 07/28/19 1402)  vancomycin (VANCOCIN) 2,000 mg in sodium chloride 0.9 % 500 mL IVPB (2,000  mg Intravenous New Bag/Given 07/28/19 1531)  piperacillin-tazobactam (ZOSYN) IVPB 3.375 g (0 g Intravenous Stopped 07/28/19 1515)  morphine 4 MG/ML injection 4 mg (4 mg Intravenous Given 07/28/19 1402)  ondansetron (ZOFRAN) injection 4 mg (4 mg Intravenous Given 07/28/19 1402)    Mobility walks with device

## 2019-07-28 NOTE — Progress Notes (Signed)
A consult was received from an ED physician for Vancomycin per pharmacy dosing.  The patient's profile has been reviewed for ht/wt/allergies/indication/available labs.   A one time order has been placed for Vancomycin 2gm IV.  Further antibiotics/pharmacy consults should be ordered by admitting physician if indicated.                       Thank you, Biagio Borg 07/28/2019  1:27 PM

## 2019-07-28 NOTE — H&P (Signed)
History and Physical    Darius Bond ZOX:[id number] DOB: [birth year] DOA: 07/28/2019  [facility]P: [facility]  Patient coming from:   I have personally briefly reviewed patient's old medical records in [facility]  Chief Complaint: left second toe pain and swelling since one week.   HPI: Darius Bond is a 63 y.o. 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 Pt denies any fevers or chills, sob, chest pain or cough, no nausea, vomiting, abdominal pain, or diarrhea,. He denies any dysuria, headache, dizziness, syncope, . Pt denies any injury to the second toe. He continues to smoke and takes alcohol occasionally. He denies any malena, hematochezia.   ED Course: on arrival to ED, he was found to be afebrile, normotensive, labs significant for creatnine of 164, BUN OF 26, sodium of 131, gluose of 274, lactic acid of 0.8 , wbc count of 14.1, hemoglobin of 11.1. blood cultures done and pending. covid 19 is negative. X ray of the left foot shows Osteomyelitis of the second toe with partial erosion of the distal phalanx. He was started on IV fluids and one dose of IV vancomycin and Zosyn given.  He was referred to medical service for admission.   Review of Systems: As per HPI All others reviewed and are negative.  Past Medical History:  Diagnosis Date  . Diabetes mellitus without complication (HCC)   . Hypertension     History reviewed. No pertinent surgical history.    Social history   reports that he has been smoking cigarettes. He has been smoking about 1.00 pack per day. He has never used smokeless tobacco. He reports previous alcohol use. No history on file for drug.  Allergies  Allergen Reactions  . Glipizide Swelling    Tongue swelling    family history of DM  present.   Prior to Admission medications   Medication Sig Start Date End Date Taking? Authorizing Provider  acetaminophen (TYLENOL) 500 MG tablet Take 1 tablet (500 mg total) by mouth every 6 (six) hours as needed. Patient taking differently: Take 500 mg by mouth every 6 (six) hours as needed for moderate pain or headache.  07/23/19  Yes Fayrene Helper, PA-C  Alogliptin Benzoate 25 MG TABS Take 12.5 mg by mouth daily. 04/23/19  Yes Plunkett, Alphonzo Lemmings, MD  amLODipine (NORVASC) 5 MG tablet Take 5 mg by mouth daily.   Yes [provider]  aspirin 81 MG chewable tablet Chew 81 mg by mouth daily.   Yes [provider]  benzonatate (TESSALON) 100 MG capsule Take 1 capsule (100 mg total) by mouth every 8 (eight) hours. Patient taking differently: Take 100 mg by mouth every 8 (eight) hours as needed for cough.  07/23/19  Yes Fayrene Helper, PA-C  carvedilol (COREG) 12.5 MG tablet Take 12.5 mg by mouth 2 (two) times daily.   Yes [provider]  FLUoxetine (PROZAC) 20 MG capsule Take 60 mg by mouth daily.   Yes [provider]  hydrochlorothiazide (HYDRODIURIL) 25 MG tablet Take 25 mg by mouth daily.   Yes [provider]  hydrOXYzine (ATARAX/VISTARIL) 25 MG tablet Take 25 mg by mouth 2 (two) times daily.   Yes [provider]  insulin glargine (LANTUS) 100 UNIT/ML injection Inject 12 Units into the skin at bedtime.  02/24/19  Yes [provider]  latanoprost (XALATAN) 0.005 % ophthalmic solution Place 1 drop into both eyes at bedtime.   Yes [provider]  Melatonin 3 MG TABS Take 6 mg by mouth at bedtime.   Yes [provider]  methocarbamol (ROBAXIN) 750 MG tablet Take 750 mg by mouth 2 (two) times daily as needed (back pain).    Yes [provider]  polyvinyl alcohol (LIQUIFILM TEARS) 1.4 % ophthalmic solution Place 1 drop into both eyes as needed for dry eyes.   Yes [provider]  simvastatin (ZOCOR) 20 MG  tablet Take 20 mg by mouth daily.   Yes [provider]  tamsulosin (FLOMAX) 0.4 MG CAPS capsule Take 0.4 mg by mouth at bedtime.    Yes [provider]  traZODone (DESYREL) 100 MG tablet Take 200 mg by mouth at bedtime.    Yes [provider]    Physical Exam:  Constitutional: NAD, calm, comfortable Vitals:   07/28/19 1530 07/28/19 1600 07/28/19 1712 07/28/19 2059  BP: (!) 141/79 (!) 150/74 131/73 139/69  Pulse: 66 65 67 66  Resp: 16 17 18 18   Temp:   97.9 F (36.6 C) 97.8 F (36.6 C)  TempSrc:   Oral   SpO2: 100% 97% 98% 98%  Weight:      Height:       Eyes: PERRL, lids and conjunctivae normal ENMT: Mucous membranes are moist. Neck: normal, supple, no masses, no thyromegaly Respiratory: clear to auscultation bilaterally, no wheezing, no crackles Cardiovascular: Regular rate and rhythm, no murmurs, S1S2.  Abdomen: soft, obese, organomegaly cannot be appreciated. Bowel sounds good.  Musculoskeletal: left leg swollen and tender. Left second toe ulcer at the tip. Non draining. Left great toe amputated.   Skin: left second toe ulcer at the tip.  Neurologic: CN 2-12 grossly intact. Grossly non focal.   Psychiatric: Normal judgment and insight. Alert and oriented x 3. Normal mood.     Labs on Admission: I have personally reviewed following labs and imaging studies  CBC: Recent Labs  Lab 07/28/19 1325  WBC 14.1*  NEUTROABS 9.7*  HGB 11.1*  HCT 32.8*  MCV 79.8*  PLT 285   Basic Metabolic Panel: Recent Labs  Lab 07/23/19 1112 07/28/19 1325  NA 131* 131*  K 4.4 4.4  CL 97* 100  CO2 26 22  GLUCOSE 299* 274*  BUN 26* 26*  CREATININE 1.84* 1.64*  CALCIUM 8.6* 8.3*   GFR: Estimated Creatinine Clearance: 53.3 mL/min (A) (by C-G formula based on SCr of 1.64 mg/dL (H)). Liver Function Tests: No results for input(s): AST, ALT, ALKPHOS, BILITOT, PROT, ALBUMIN in the last 168 hours. No results for input(s): LIPASE, AMYLASE in the last 168  hours. No results for input(s): AMMONIA in the last 168 hours. Coagulation Profile: No results for input(s): INR, PROTIME in the last 168 hours. Cardiac Enzymes: No results for input(s): CKTOTAL, CKMB, CKMBINDEX, TROPONINI in the last 168 hours. BNP (last 3 results) No results for input(s): PROBNP in the last 8760 hours. HbA1C: No results for input(s): HGBA1C in the last 72 hours. CBG: No results for input(s): GLUCAP in the last 168 hours. Lipid Profile: No results for input(s): CHOL, HDL, LDLCALC, TRIG, CHOLHDL, LDLDIRECT in the last 72 hours. Thyroid Function Tests: No results for input(s): TSH, T4TOTAL, FREET4, T3FREE, THYROIDAB in the last 72 hours. Anemia Panel: No results for input(s): VITAMINB12, FOLATE, FERRITIN, TIBC, IRON, RETICCTPCT in the last 72 hours. Urine analysis: No results found for:  COLORURINE, APPEARANCEUR, LABSPEC, PHURINE, GLUCOSEU, HGBUR, BILIRUBINUR, KETONESUR, PROTEINUR, UROBILINOGEN, NITRITE, LEUKOCYTESUR  Radiological Exams on Admission: Dg Foot Complete Left  Result Date: 07/28/2019 CLINICAL DATA:  Amputation earlier this year. Diabetes. Question second toe osteomyelitis. EXAM: LEFT FOOT - COMPLETE 3+ VIEW COMPARISON:  None. FINDINGS: Previous partial amputation of the great toe with residual proximal phalanx. That bone is slightly irregular and indeterminate for residual infection. There is air/gas in the soft tissues of the second toe with erosion of the distal phalanx consistent with osteomyelitis. Extensive regional arterial calcification is noted. Generalized soft tissue swelling of the foot. IMPRESSION: Osteomyelitis of the second toe with partial erosion of the distal phalanx. Previous amputation of the great toe. Distal bone margin of the phalanx is somewhat irregular which could be normal or reflect residual or recurrent infection in that bone. Electronically Signed   By: Paulina FusiMark  Shogry M.D.   On: 07/28/2019 14:40   Vas [place] Lower Extremity Venous (dvt)  7a-7p  Result Date: 07/28/2019  Lower Venous Study Indications: Swelling.  Risk Factors: None identified. Comparison Study: No prior studies. Performing Technologist: Chanda BusingGregory Collins RVT  Examination Guidelines: A complete evaluation includes B-mode imaging, spectral Doppler, color Doppler, and power Doppler as needed of all accessible portions of each vessel. Bilateral testing is considered an integral part of a complete examination. Limited examinations for reoccurring indications may be performed as noted.  +-----+---------------+---------+-----------+----------+--------------+ RIGHTCompressibilityPhasicitySpontaneityPropertiesThrombus Aging +-----+---------------+---------+-----------+----------+--------------+ CFV  Full           Yes      Yes                                 +-----+---------------+---------+-----------+----------+--------------+   +---------+---------------+---------+-----------+----------+--------------+ LEFT     CompressibilityPhasicitySpontaneityPropertiesThrombus Aging +---------+---------------+---------+-----------+----------+--------------+ CFV      Full           Yes      Yes                                 +---------+---------------+---------+-----------+----------+--------------+ SFJ      Full                                                        +---------+---------------+---------+-----------+----------+--------------+ FV Prox  Full                                                        +---------+---------------+---------+-----------+----------+--------------+ FV Mid   Full                                                        +---------+---------------+---------+-----------+----------+--------------+ FV DistalFull                                                        +---------+---------------+---------+-----------+----------+--------------+  PFV      Full                                                         +---------+---------------+---------+-----------+----------+--------------+ POP      Full           Yes      Yes                                 +---------+---------------+---------+-----------+----------+--------------+ PTV      Full                                                        +---------+---------------+---------+-----------+----------+--------------+ PERO     Full                                                        +---------+---------------+---------+-----------+----------+--------------+     Summary: Right: No evidence of common femoral vein obstruction. Left: There is no evidence of deep vein thrombosis in the lower extremity. No cystic structure found in the popliteal fossa.  *See table(s) above for measurements and observations.    Preliminary     EKG: Independently reviewed. Sinus with prolonged PR interval.   Assessment/Plan Active Problems:   Osteomyelitis (HCC)   AKI (acute kidney injury) ([place])   Essential hypertension   Type 2 diabetes mellitus with foot ulcer (HCC)    Left second toe osteomyelitis with surrounding cellulitis.  - admit for broad spectrum IV antibiotics, IV vancomycin and IV zosyn.  - called orthopedics consult for ? Amputation of the second toe. NPO after midnight. - venous duplex ruled out DVT.  Pain control with IV morphine and oral tramadol.     Hypertension:  Well controlled.    Homelessness:  Social work consult.    Diabetes mellitus:  CBG (last 3)  No results for input(s): GLUCAP in the last 72 hours.  Start SSI, and get A1c.   AKI;  Unclear his baseline creatinine. ? ATN From toe infection vs pre renal.  Gently hydration and repeat renal parameters in am.  Get UA, US renal and urine electrolytes to check Fe NA.  Check urine output.   Severity of Illness: The appropriate patient status for this patient is INPATIENT. Inpatient status is judged to be reasonable and necessary in order to provide the required  intensity of service to ensure the patient's safety. The patient's presenting symptoms, physical exam findings, and initial radiographic and laboratory data in the context of their chronic comorbidities is felt to place them at high risk for further clinical deterioration. Furthermore, it is not anticipated that the patient will be medically stable for discharge from the hospital within 2 midnights of admission. The following factors support the patient status of inpatient.   " The patient's presenting symptoms include second toe ulcer and left leg swelling. " The worrisome physical exam findings include 2+ leg edema with tenderness.  "  The initial radiographic and laboratory data are worrisome because of osteomyelitis.  " The chronic co-morbidities include DM and hypertension.    * I certify that at the point of admission it is my clinical judgment that the patient will require inpatient hospital care spanning beyond 2 midnights from the point of admission due to high intensity of service, high risk for further deterioration and high frequency of surveillance required.*     DVT prophylaxis:  Lovenox.  Code Status: full code.  Family Communication: none at bedside.  Disposition Plan: pending further work up.  Consults called:  Orthopedics [facility].  Admission status: inpatient/ medical floor.    Kathlen Mody MD [facility] Pager [phone]  If 7PM-7AM, please contact night-coverage [email or web address] Password [facility]  07/28/2019, 9:48 PM

## 2019-07-28 NOTE — Progress Notes (Signed)
Pharmacy Antibiotic Note  Darius Bond is a 63 y.o. male admitted on 07/28/2019 with osteomyelitis.  Pharmacy has been consulted for vancomycin and piperacillin/tazobactam dosing.  Pt admitted with left foot wound. PMH significant for DM as well as OM resulting in amputation of left toe in May 2020.  Today, 07/28/19  WBC 14.1  SCr 1.64, CrCL ~50 mL/min  Afebrile  Plan:  Piperacillin/tazobactam 3.375 g IV q8h  Vancomycin 2000 mg LD followed by 1000 mg IV q24h  Goal AUC 400-550  Follow renal function and culture data  Check vancomycin levels once at steady state if indicated  Height: 5\' 3"  (160 cm) Weight: 268 lb (121.6 kg) IBW/kg (Calculated) : 56.9  Temp (24hrs), Avg:98.4 F (36.9 C), Min:97.9 F (36.6 C), Max:98.8 F (37.1 C)  Recent Labs  Lab 07/23/19 1112 07/28/19 1325  WBC  --  14.1*  CREATININE 1.84* 1.64*  LATICACIDVEN  --  0.8    Estimated Creatinine Clearance: 53.3 mL/min (A) (by C-G formula based on SCr of 1.64 mg/dL (H)).    Allergies  Allergen Reactions  . Glipizide Swelling    Tongue swelling    Antimicrobials this admission: Piperacillin/tazobactam 8/26 >>  vancomycin 8/26 >>   Dose adjustments this admission:  Microbiology results: 8/26 BCx: Sent 8/26 SARS-2: Sent  Thank you for allowing pharmacy to be a part of this patient's care.  Lenis Noon, PharmD 07/28/2019 7:17 PM

## 2019-07-28 NOTE — ED Triage Notes (Signed)
Per EMS: Pt states he had a "core" on his second toe on his left foot that came out 2 days ago.  The wound is purulent, foot is swollen and painful.  Pt hx of diabetes.

## 2019-07-28 NOTE — ED Provider Notes (Addendum)
[facility]-EMERGENCY DEPT Provider Note   CSN: [id number] Arrival date & time: 07/28/19  1253     History   Chief Complaint Chief Complaint  Patient presents with  . L foot wound    HPI Darius Bond is a 63 y.o. male.     Pt presents to the ED today with left 2nd toe pain and left leg swelling.  Pt said he's had a sore to his toe for awhile, but it became acutely worse about 2 days ago.  The pt denies f/c.  He is a diabetic, but said his blood sugars have been in the 160s.  He did have osteomyelitis of his left 1st toe and it was amputated in May of 2020.       Past Medical History:  Diagnosis Date  . Diabetes mellitus without complication (HCC)   . Hypertension     There are no active problems to display for this patient.   History reviewed. No pertinent surgical history.      Home Medications    Prior to Admission medications   Medication Sig Start Date End Date Taking? Authorizing Provider  acetaminophen (TYLENOL) 500 MG tablet Take 1 tablet (500 mg total) by mouth every 6 (six) hours as needed. Patient taking differently: Take 500 mg by mouth every 6 (six) hours as needed for moderate pain or headache.  07/23/19   Fayrene Helperran, [place], PA-C  Alogliptin Benzoate 25 MG TABS Take 12.5 mg by mouth daily. 04/23/19   Gwyneth SproutPlunkett, [place], MD  amLODipine (NORVASC) 5 MG tablet Take 5 mg by mouth daily.    [provider]  aspirin 81 MG chewable tablet Chew 81 mg by mouth daily.    [provider]  benzonatate (TESSALON) 100 MG capsule Take 1 capsule (100 mg total) by mouth every 8 (eight) hours. 07/23/19   Fayrene Helperran, [place], PA-C  carvedilol (COREG) 12.5 MG tablet Take 12.5 mg by mouth 2 (two) times daily.    [provider]  FLUoxetine (PROZAC) 20 MG capsule Take 60 mg by mouth daily.    [provider]  hydrochlorothiazide (HYDRODIURIL) 25 MG tablet Take 25 mg by mouth daily.    [provider]  hydrOXYzine  (ATARAX/VISTARIL) 25 MG tablet Take 25 mg by mouth 2 (two) times daily.    [provider]  insulin glargine (LANTUS) 100 UNIT/ML injection Inject 24 Units into the skin daily. 02/24/19   [provider]  latanoprost (XALATAN) 0.005 % ophthalmic solution Place 1 drop into both eyes at bedtime.    [provider]  Melatonin 3 MG TABS Take 6 mg by mouth at bedtime.    [provider]  methocarbamol (ROBAXIN) 750 MG tablet Take 750 mg by mouth 2 (two) times daily.    [provider]  polyvinyl alcohol (LIQUIFILM TEARS) 1.4 % ophthalmic solution Place 1 drop into both eyes as needed for dry eyes.    [provider]  simvastatin (ZOCOR) 20 MG tablet Take 20 mg by mouth daily.    [provider]  tamsulosin (FLOMAX) 0.4 MG CAPS capsule Take 0.4 mg by mouth daily.    [provider]  traMADol (ULTRAM) 50 MG tablet Take 50 mg by mouth every 6 (six) hours as needed for moderate pain.    [provider]  traZODone (DESYREL) 100 MG tablet Take 500 mg by mouth at bedtime.    [provider]    Family History No family history on file.  Social  History Social History   Tobacco Use  . Smoking status: Current Every Day Smoker    Packs/day: 1.00    Types: Cigarettes  . Smokeless tobacco: Never Used  Substance Use Topics  . Alcohol use: Not Currently  . Drug use: Not on file     Allergies   Glipizide   Review of Systems Review of Systems  Musculoskeletal:       Left 2nd toe pain and redness  All other systems reviewed and are negative.    Physical Exam Updated Vital Signs BP 129/79   Pulse 69   Temp 98.8 F (37.1 C) (Oral)   Resp 17   Ht 5\' 3"  (1.6 m)   Wt 121.6 kg   SpO2 100%   BMI 47.47 kg/m   Physical Exam Vitals signs and nursing note reviewed.  Constitutional:      Appearance: Normal appearance.  HENT:     Head: Normocephalic and atraumatic.     Right Ear: External ear normal.      Left Ear: External ear normal.     Nose: Nose normal.     Mouth/Throat:     Mouth: Mucous membranes are moist.     Pharynx: Oropharynx is clear.  Eyes:     Extraocular Movements: Extraocular movements intact.     Conjunctiva/sclera: Conjunctivae normal.     Pupils: Pupils are equal, round, and reactive to light.  Neck:     Musculoskeletal: Normal range of motion and neck supple.  Cardiovascular:     Rate and Rhythm: Normal rate and regular rhythm.     Pulses: Normal pulses.     Heart sounds: Normal heart sounds.  Pulmonary:     Effort: Pulmonary effort is normal.     Breath sounds: Normal breath sounds.  Abdominal:     General: Abdomen is flat. Bowel sounds are normal.  Musculoskeletal:     Comments: Left 2nd toe redness, swelling, foul odor.  LLE with swelling and redness up to knee.  See picture.  Skin:    General: Skin is warm.     Capillary Refill: Capillary refill takes less than 2 seconds.  Neurological:     General: No focal deficit present.     Mental Status: He is alert and oriented to person, place, and time.  Psychiatric:        Mood and Affect: Mood normal.        Behavior: Behavior normal.        ED Treatments / Results  Labs (all labs ordered are listed, but only abnormal results are displayed) Labs Reviewed  BASIC METABOLIC PANEL - Abnormal; Notable for the following components:      Result Value   Sodium 131 (*)    Glucose, Bld 274 (*)    BUN 26 (*)    Creatinine, Ser 1.64 (*)    Calcium 8.3 (*)    GFR calc non Af Amer 44 (*)    GFR calc Af Amer 50 (*)    All other components within normal limits  CBC WITH DIFFERENTIAL/PLATELET - Abnormal; Notable for the following components:   WBC 14.1 (*)    RBC 4.11 (*)    Hemoglobin 11.1 (*)    HCT 32.8 (*)    MCV 79.8 (*)    All other components within normal limits  CULTURE, BLOOD (ROUTINE X 2)  SARS CORONAVIRUS 2 (TAT 6-12 HRS)  LACTIC ACID, PLASMA    EKG EKG  Interpretation  Date/Time:  Wednesday July 28 2019 13:58:36 EDT Ventricular Rate:  69 PR Interval:    QRS Duration: 88 QT Interval:  410 QTC Calculation: 440 R Axis:   -44 Text Interpretation:  Sinus rhythm Borderline prolonged PR interval Left anterior fascicular block Low voltage, precordial leads Abnormal R-wave progression, early transition Consider anterior infarct No significant change since last tracing Confirmed by Isla Pence [phone]) on 07/28/2019 2:08:17 PM   Radiology Dg Foot Complete Left  Result Date: 07/28/2019 CLINICAL DATA:  Amputation earlier this year. Diabetes. Question second toe osteomyelitis. EXAM: LEFT FOOT - COMPLETE 3+ VIEW COMPARISON:  None. FINDINGS: Previous partial amputation of the great toe with residual proximal phalanx. That bone is slightly irregular and indeterminate for residual infection. There is air/gas in the soft tissues of the second toe with erosion of the distal phalanx consistent with osteomyelitis. Extensive regional arterial calcification is noted. Generalized soft tissue swelling of the foot. IMPRESSION: Osteomyelitis of the second toe with partial erosion of the distal phalanx. Previous amputation of the great toe. Distal bone margin of the phalanx is somewhat irregular which could be normal or reflect residual or recurrent infection in that bone. Electronically Signed   By: Nelson Chimes M.D.   On: 07/28/2019 14:40    Procedures Procedures (including critical care time)  Medications Ordered in ED Medications  0.9 %  sodium chloride infusion ( Intravenous New Bag/Given 07/28/19 1402)  vancomycin (VANCOCIN) 2,000 mg in sodium chloride 0.9 % 500 mL IVPB (2,000 mg Intravenous New Bag/Given 07/28/19 1531)  piperacillin-tazobactam (ZOSYN) IVPB 3.375 g (0 g Intravenous Stopped 07/28/19 1515)  morphine 4 MG/ML injection 4 mg (4 mg Intravenous Given 07/28/19 1402)  ondansetron (ZOFRAN) injection 4 mg (4 mg Intravenous Given 07/28/19 1402)      Initial Impression / Assessment and Plan / ED Course  I have reviewed the triage vital signs and the nursing notes.  Pertinent labs & imaging results that were available during my care of the patient were reviewed by me and considered in my medical decision making (see chart for details).     Pt does have osteomyelitis to his left 2nd toe with cellulitis to his left leg.  No DVT.  Pt treated with zosyn and with vancomycin.    No DVT.  Pt d/w Dr. Karleen Hampshire ([facility]) for admission.  CRITICAL CARE Performed by: Isla Pence   Total critical care time: 30 minutes  Critical care time was exclusive of separately billable procedures and treating other patients.  Critical care was necessary to treat or prevent imminent or life-threatening deterioration.  Critical care was time spent personally by me on the following activities: development of treatment plan with patient and/or surrogate as well as nursing, discussions with consultants, evaluation of patient's response to treatment, examination of patient, obtaining history from patient or surrogate, ordering and performing treatments and interventions, ordering and review of laboratory studies, ordering and review of radiographic studies, pulse oximetry and re-evaluation of patient's condition.  Final Clinical Impressions(s) / ED Diagnoses   Final diagnoses:  Other acute osteomyelitis of left foot (HCC)  CKD (chronic kidney disease) stage 3, GFR 30-59 ml/min (HCC)  Poorly controlled type 2 diabetes mellitus [facility])    ED Discharge Orders    None       Isla Pence, MD 07/28/19 1540    Isla Pence, MD 08/03/19 [phone]

## 2019-07-29 ENCOUNTER — Inpatient Hospital Stay (HOSPITAL_COMMUNITY): Payer: Medicare PPO

## 2019-07-29 ENCOUNTER — Other Ambulatory Visit: Payer: Self-pay

## 2019-07-29 DIAGNOSIS — R0609 Other forms of dyspnea: Secondary | ICD-10-CM

## 2019-07-29 DIAGNOSIS — E1165 Type 2 diabetes mellitus with hyperglycemia: Secondary | ICD-10-CM

## 2019-07-29 DIAGNOSIS — M86172 Other acute osteomyelitis, left ankle and foot: Secondary | ICD-10-CM

## 2019-07-29 LAB — ECHOCARDIOGRAM COMPLETE
Height: 63 in
Weight: 4288 oz

## 2019-07-29 LAB — BASIC METABOLIC PANEL
Anion gap: 7 (ref 5–15)
BUN: 24 mg/dL — ABNORMAL HIGH (ref 8–23)
CO2: 22 mmol/L (ref 22–32)
Calcium: 7.9 mg/dL — ABNORMAL LOW (ref 8.9–10.3)
Chloride: 103 mmol/L (ref 98–111)
Creatinine, Ser: 1.68 mg/dL — ABNORMAL HIGH (ref 0.61–1.24)
GFR calc Af Amer: 49 mL/min — ABNORMAL LOW (ref >60)
GFR calc non Af Amer: 42 mL/min — ABNORMAL LOW (ref >60)
Glucose, Bld: 253 mg/dL — ABNORMAL HIGH (ref 70–99)
Potassium: 4 mmol/L (ref 3.5–5.1)
Sodium: 132 mmol/L — ABNORMAL LOW (ref 135–145)

## 2019-07-29 LAB — URINALYSIS, ROUTINE W REFLEX MICROSCOPIC
Bilirubin Urine: NEGATIVE
Glucose, UA: 50 mg/dL — AB
Ketones, ur: NEGATIVE mg/dL
Leukocytes,Ua: NEGATIVE
Nitrite: NEGATIVE
Protein, ur: 30 mg/dL — AB
Specific Gravity, Urine: 1.012 (ref 1.005–1.030)
pH: 5 (ref 5.0–8.0)

## 2019-07-29 LAB — CBC
HCT: 32.6 % — ABNORMAL LOW (ref 39.0–52.0)
Hemoglobin: 10.7 g/dL — ABNORMAL LOW (ref 13.0–17.0)
MCH: 26.7 pg (ref 26.0–34.0)
MCHC: 32.8 g/dL (ref 30.0–36.0)
MCV: 81.3 fL (ref 80.0–100.0)
Platelets: 292 10*3/uL (ref 150–400)
RBC: 4.01 MIL/uL — ABNORMAL LOW (ref 4.22–5.81)
RDW: 13.7 % (ref 11.5–15.5)
WBC: 9.8 10*3/uL (ref 4.0–10.5)
nRBC: 0 % (ref 0.0–0.2)

## 2019-07-29 LAB — GLUCOSE, CAPILLARY
Glucose-Capillary: 170 mg/dL — ABNORMAL HIGH (ref 70–99)
Glucose-Capillary: 183 mg/dL — ABNORMAL HIGH (ref 70–99)
Glucose-Capillary: 151 mg/dL — ABNORMAL HIGH (ref 70–99)
Glucose-Capillary: 149 mg/dL — ABNORMAL HIGH (ref 70–99)
Glucose-Capillary: 128 mg/dL — ABNORMAL HIGH (ref 70–99)

## 2019-07-29 LAB — HEMOGLOBIN A1C
Hgb A1c MFr Bld: 11.6 % — ABNORMAL HIGH (ref 4.8–5.6)
Mean Plasma Glucose: 286.22 mg/dL

## 2019-07-29 LAB — CREATININE, URINE, RANDOM: Creatinine, Urine: 117.74 mg/dL

## 2019-07-29 LAB — HIV ANTIBODY (ROUTINE TESTING W REFLEX): HIV Screen 4th Generation wRfx: NONREACTIVE

## 2019-07-29 LAB — SODIUM, URINE, RANDOM: Sodium, Ur: 34 mmol/L

## 2019-07-29 MED ORDER — PREGABALIN 75 MG PO CAPS
75.0000 mg | ORAL_CAPSULE | Freq: Two times a day (BID) | ORAL | Status: DC
  Administered 2019-07-29 – 2019-08-01 (×7): 75 mg via ORAL
  Filled 2019-07-29 (×7): qty 1

## 2019-07-29 MED ORDER — SODIUM CHLORIDE 0.9 % IV SOLN
INTRAVENOUS | Status: DC | PRN
  Administered 2019-07-29: 22:00:00 250 mL via INTRAVENOUS

## 2019-07-29 MED ORDER — TRAMADOL HCL 50 MG PO TABS
50.0000 mg | ORAL_TABLET | Freq: Four times a day (QID) | ORAL | Status: DC | PRN
  Filled 2019-07-29: qty 1

## 2019-07-29 MED ORDER — INSULIN GLARGINE 100 UNIT/ML ~~LOC~~ SOLN
18.0000 [IU] | Freq: Every day | SUBCUTANEOUS | Status: DC
  Administered 2019-07-29 – 2019-07-31 (×3): 18 [IU] via SUBCUTANEOUS
  Filled 2019-07-29 (×4): qty 0.18

## 2019-07-29 NOTE — Plan of Care (Signed)

## 2019-07-29 NOTE — Progress Notes (Signed)
Echocardiogram 2D Echocardiogram has been performed.  Oneal Deputy Shalawn Wynder 07/29/2019, 11:14 AM

## 2019-07-29 NOTE — Consult Note (Signed)
Reason for Consult: Left second toe diabetic infection Referring Physician: Dr. Stormy Fabian Bond is an 63 y.o. male.  HPI: Darius Bond presented to the emergency department with worsening left toe swelling and drainage.  He had prior history of partial hallux amputation at the [place] back in May 2020.  Patient states for the past several weeks he is noted worsening swelling and developed drainage of the second toe.  He denies any pain.  States he does have neuropathy.  Patient denies any other medical condition outside of his diabetes.  Patient has had uncontrolled sugars recently.  A1c since admission is 11.6.  Patient denies any other complaints.  He denies any fevers or chills.  He has been admitted to the hospitalist service for antibiotic treatment.  He was placed on Zosyn and vancomycin.  He had leukocytosis in the emergency department.  Past Medical History:  Diagnosis Date  . Diabetes mellitus without complication ([place])   . Hypertension     History reviewed. No pertinent surgical history.  No family history on file.  Social History:  reports that he has been smoking cigarettes. He has been smoking about 1.00 pack per day. He has never used smokeless tobacco. He reports previous alcohol use. No history on file for drug.  Allergies:  Allergies  Allergen Reactions  . Glipizide Swelling    Tongue swelling    Medications: I have reviewed the patient's current medications.    Review of Systems  Constitutional: Negative.   Eyes: Negative.   Respiratory: Negative.   Cardiovascular: Negative.   Gastrointestinal: Negative.   Musculoskeletal:       Left 2nd toe swelling and drainage  Skin: Negative.   Neurological: Negative.   Psychiatric/Behavioral: Negative.    Blood pressure 130/76, pulse 63, temperature 97.7 F (36.5 C), temperature source Oral, resp. rate 16, height 5\' 3"  (1.6 m), weight 121.6 kg, SpO2 97 %. Physical Exam  Constitutional: He appears well-developed.   HENT:  Head: Normocephalic.  Eyes: Conjunctivae are normal.  Cardiovascular: Normal rate.  Respiratory: Effort normal.  GI: Soft.  Musculoskeletal:     Comments: Left foot demonstrates swelling and drainage of the second toe.  There is foul odor.  There is sloughing of the skin at the distal tip from the DIP joint distally.  Unable to see the toenail.  He has evidence of partial hallux amputation Hips with good healing.  No evidence of other lesser toe injury.  There is swelling about the foot to the level of the ankle.  No pain to palpation of the left knee.  No pain with logroll the left hip.  No evidence of other wounds on the right foot.  Neurological: He is alert.  Skin: Skin is warm.  Psychiatric: He has a normal mood and affect.    Assessment/Plan: Patient has left second toe diabetic infection.  X-rays show soft tissue gas.  No obvious fragmentation of the bones.  Radiologist read as osteomyelitis but I do not see the fragmentation.  At the bedside I debrided the tip of the toe.  There was full-thickness epidermal skin loss of the toe.  After this was removed I was able to express purulent material from the tip of the toe.  There was some continued swelling.  No exposed bone.  I had a lengthy discussion with the patient.  He will likely require second toe amputation either at the MTP joint or through the PIP joint.  We will give it 1 more day of antibiotics  to see if the soft tissue dries out.  If not we will plan for amputation.  He denies any fevers or chills.  His leukocytosis has been improving.  His left lower leg swelling has been improving since antibiotic treatment.  I will see him in the morning and reevaluate the toe and the need for amputation.  Darius HartChristopher R Everli Bond 07/29/2019, 2:50 PM

## 2019-07-29 NOTE — Consult Note (Signed)
Brief orthopedic consult note:  I have reviewed the images.  There is soft tissue gas about the second toe with apparent erosions of the distal phalanx.  He has been admitted for antibiotics.  He has poorly controlled diabetes with hyperglycemia and recent amputation of the hallux at the [place] in May 2020.  He has leukocytosis.  Pending hemoglobin A1c.  We will see how he responds to antibiotics but he may ultimately require partial amputation of the second toe as well.  Will reevaluate later this afternoon and determine definitive treatment.  Keep n.p.o. for now

## 2019-07-29 NOTE — Progress Notes (Signed)
Inpatient Diabetes Program Recommendations  AACE/ADA: New Consensus Statement on Inpatient Glycemic Control (2015)  Target Ranges:  Prepandial:   less than 140 mg/dL      Peak postprandial:   less than 180 mg/dL (1-2 hours)      Critically ill patients:  140 - 180 mg/dL   Lab Results  Component Value Date   GLUCAP 149 (H) 07/29/2019   HGBA1C 11.6 (H) 07/29/2019    Review of Glycemic Control  Diabetes history: DM2 Outpatient Diabetes medications: Lantus 12 units QHS Current orders for Inpatient glycemic control: Lantus 18 units QHS, Novolog 0-9 units tidwc and hs, tradjenta 5 mg   HgbA1C - 11.6% - uncontrolled. May not be accurate with low H/H. Pt was NPO most of the day and blood sugars have trended well. An improvement from yesterday.   Inpatient Diabetes Program Recommendations:     Likely needs rapid-acting insulin at home.   Agree with orders at present. If post-prandial blood sugars > 180 mg/dL, add Novolog 3 units tidwc if pt eats > 50% meal  Spoke with pt regarding HgbA1c of 11.6%. Pt states he takes his Lantus "most of the time." Does not have trouble getting it. Sees MD at [facility] for his diabetes management. May have toe amputation, will find out in am. Checks blood sugars 1x/day in am on most days. States it's usually below 200 mg/dL. Pt states he eats sugary foods because he craves them. Discussed importance of getting blood sugars in better control and he shouldn't crave sweets as much. Stressed [facility] for improving his glycemic control to reduce risks of complications. Pt seems motivated to do a better job with his diabetes.  Continue to follow.  Thank you. Lorenda Peck, RD, LDN, CDE Inpatient Diabetes Coordinator [phone]

## 2019-07-29 NOTE — Progress Notes (Signed)
PROGRESS NOTE  Darius Bond VEH:[id number] DOB: [birth year] DOA: 07/28/2019 [facility]  Brief History   63 year old African-American male former veteran currently homeless staying at homeless shelter recent visit to emergency room 8/21 mild AKI COVID negative Known PAD followed at [facility] Dr. Lennette Bihari Chang-prior cocaine abuse,  CAD status post PCI ~ 2015 (sounds like he had a coronary event-no other information is available given he got this done in [place] at the [place]),  diabetes mellitus with only moderate control A1c 8.9--01/2019  Recent ABIs right side 0.7 left side 0.8  Started having left toe ulceration 6 weeks prior to 03/25/2019 followed with podiatrist in [place] to have osteomyelitis recommended partial hallux amputation at that time  Returns to emergency room with worsening foot wound  On admit sodium 131 BUN/creatinine 26/1.6 lactic acid 0.8-hemoglobin 10.7 WBC 14.1 predominant neutrophilia--- foot x-ray left side revealed osteomyelitis second toe with particular erosion distal phalanx  Dr. Lucia Gaskins of orthopedics consulted  A & P  Osteomyelitis second toe Zosyn/vancomycin, cycle CBC a.m.-operative management deferred to orthopedics Pain control with Tylenol first choice, tramadol every 6 as needed severe pain-resume home Robaxin on admission CAD status post PCI 2015 HTN Compensated probable diastolic heart failure Habitus for sleep apnea Continue amlodipine 5 Coreg 12.5 twice daily Zocor 20 daily--blood pressure at this time is moderately controlled Holding HCTZ 25 at this time Poorly controlled diabetes mellitus last A1c 8.9 on 02/19/2019 with diabetic nephropathy in addition to neuropathy Peripheral arterial disease in the setting of prior smoking use--recent ABIs as above Metabolic syndrome Home meds Lantus 12--also continuing linagliptin 5 Sugars here 170-250 with A1c 11.6 Will increase Lantus to 18 units today --D<M  coordinator to see and educate   States is on Lyrica at home I will start back low-dose 75 twice daily although this is not on MAR Chronic kidney disease stage II-III Secondary to poorly controlled diabetes HTN etc.-monitor trends continue saline 50 cc an hour discontinue HCTZ ongoing not a candidate for ACE because of this BMI 44 Requires medically supervised weight loss may consider bariatric referral at [facility]  DVT prophylaxis: Lovenox Code Status: Full Family Communication: None Disposition Plan: Inpatient pending orthopedic input   Verneita Griffes, MD [facility] 7:27 AM  07/29/2019, 7:27 AM  LOS: 1 day   Consultants  . Dr. Lucia Gaskins of orthopedics  Procedures  . None yet  Antibiotics  . [name] as above  Interval History/Subjective  Sleepy but coherent enough to answer questions Pain does not seem terrible N.p.o. for procedure-thinks that he saw the surgeon this morning but is not sure  Objective   Vitals:  Vitals:   07/28/19 2059 07/29/19 0631  BP: 139/69 116/71  Pulse: 66 64  Resp: 18 16  Temp: 97.8 F (36.6 C) 98.6 F (37 C)  SpO2: 98% 97%    Exam:  Awake coherent but in no distress Chest clear no added sound S1-S2 no murmur Poor dentition Mallampati 4 Abdomen is soft no rebound no guarding Foot on left side shows amputated great toe and swollen dark tip on second toe with swelling-I did not express any purulence   I have personally reviewed the following:   Today's Data  . White count down from 14-9.8 hemoglobin stable . BUN/creatinine stable at 24/1.6  Lab Data  .   Micro Data  .   Imaging  . Ultrasound of kidneys on 8/20 7 AM shows unremarkable study  Cardiology Data  .   Other Data  .  Scheduled Meds: . amLODipine  5 mg Oral Daily  . aspirin  81 mg Oral Daily  . carvedilol  12.5 mg Oral BID  . enoxaparin (LOVENOX) injection  40 mg Subcutaneous Q24H  . FLUoxetine  60 mg Oral Daily  . hydrOXYzine  25 mg Oral BID  . insulin  aspart  0-5 Units Subcutaneous QHS  . insulin aspart  0-9 Units Subcutaneous TID WC  . insulin glargine  12 Units Subcutaneous QHS  . latanoprost  1 drop Both Eyes QHS  . linagliptin  5 mg Oral Daily  . Melatonin  6 mg Oral QHS  . simvastatin  20 mg Oral Daily  . tamsulosin  0.4 mg Oral QHS  . traZODone  200 mg Oral QHS   Continuous Infusions: . sodium chloride 50 mL/hr at 07/28/19 2237  . piperacillin-tazobactam (ZOSYN)  IV 3.375 g (07/29/19 0630)  . vancomycin      Active Problems:   Osteomyelitis (HCC)   AKI (acute kidney injury) (HCC)   Essential hypertension   Type 2 diabetes mellitus with foot ulcer (HCC)   LOS: 1 day   How to contact the [facility] Attending or Consulting provider 7A - 7P or covering provider during after hours 7P -7A, for this patient?  1. Check the care team in [facility] and look for a) attending/consulting TRH provider listed and b) the [facility] team listed 2. Log into [email or web address] and use [place]'s universal password to access. If you do not have the password, please contact the hospital operator. 3. Locate the [facility] provider you are looking for under [facility] and page to a number that you can be directly reached. 4. If you still have difficulty reaching the provider, please page the [facility] (Director on Call) for the Hospitalists listed on amion for assistance.

## 2019-07-30 ENCOUNTER — Inpatient Hospital Stay (HOSPITAL_COMMUNITY): Payer: Medicare PPO | Admitting: Anesthesiology

## 2019-07-30 ENCOUNTER — Encounter (HOSPITAL_COMMUNITY)
Admission: EM | Disposition: A | Discharge status: Home / Self Care | Payer: Self-pay | Source: Home / Self Care | Attending: Family Medicine

## 2019-07-30 ENCOUNTER — Encounter (HOSPITAL_COMMUNITY): Payer: Self-pay | Admitting: Anesthesiology

## 2019-07-30 HISTORY — PX: AMPUTATION: SHX166

## 2019-07-30 LAB — CREATININE, SERUM
Creatinine, Ser: 1.59 mg/dL — ABNORMAL HIGH (ref 0.61–1.24)
GFR calc Af Amer: 52 mL/min — ABNORMAL LOW (ref >60)
GFR calc non Af Amer: 45 mL/min — ABNORMAL LOW (ref >60)

## 2019-07-30 LAB — SURGICAL PCR SCREEN
MRSA, PCR: NEGATIVE
Staphylococcus aureus: POSITIVE — AB

## 2019-07-30 LAB — GLUCOSE, CAPILLARY
Glucose-Capillary: 128 mg/dL — ABNORMAL HIGH (ref 70–99)
Glucose-Capillary: 113 mg/dL — ABNORMAL HIGH (ref 70–99)
Glucose-Capillary: 114 mg/dL — ABNORMAL HIGH (ref 70–99)
Glucose-Capillary: 127 mg/dL — ABNORMAL HIGH (ref 70–99)
Glucose-Capillary: 118 mg/dL — ABNORMAL HIGH (ref 70–99)
Glucose-Capillary: 134 mg/dL — ABNORMAL HIGH (ref 70–99)

## 2019-07-30 SURGERY — AMPUTATION DIGIT
Anesthesia: Monitor Anesthesia Care | Site: Toe | Laterality: Left

## 2019-07-30 MED ORDER — FENTANYL CITRATE (PF) 100 MCG/2ML IJ SOLN
INTRAMUSCULAR | Status: AC
  Filled 2019-07-30: qty 2

## 2019-07-30 MED ORDER — VANCOMYCIN HCL 1000 MG IV SOLR
INTRAVENOUS | Status: DC | PRN
  Administered 2019-07-30: 15:00:00 1000 mg via INTRAVENOUS

## 2019-07-30 MED ORDER — MEPERIDINE HCL 50 MG/ML IJ SOLN: 6.2500 mg | INTRAMUSCULAR | Status: DC | PRN

## 2019-07-30 MED ORDER — ONDANSETRON HCL 4 MG/2ML IJ SOLN
INTRAMUSCULAR | Status: DC | PRN
  Administered 2019-07-30: 4 mg via INTRAVENOUS

## 2019-07-30 MED ORDER — ONDANSETRON HCL 4 MG/2ML IJ SOLN
INTRAMUSCULAR | Status: AC
  Filled 2019-07-30: qty 2

## 2019-07-30 MED ORDER — PROPOFOL 10 MG/ML IV BOLUS
INTRAVENOUS | Status: DC | PRN
  Administered 2019-07-30: 30 mg via INTRAVENOUS

## 2019-07-30 MED ORDER — MIDAZOLAM HCL 2 MG/2ML IJ SOLN
INTRAMUSCULAR | Status: AC
  Filled 2019-07-30: qty 2

## 2019-07-30 MED ORDER — FENTANYL CITRATE (PF) 100 MCG/2ML IJ SOLN
INTRAMUSCULAR | Status: DC | PRN
  Administered 2019-07-30: 25 ug via INTRAVENOUS

## 2019-07-30 MED ORDER — PROPOFOL 10 MG/ML IV BOLUS
INTRAVENOUS | Status: AC
  Filled 2019-07-30: qty 20

## 2019-07-30 MED ORDER — PROPOFOL 500 MG/50ML IV EMUL
INTRAVENOUS | Status: DC | PRN
  Administered 2019-07-30: 100 ug/kg/min via INTRAVENOUS

## 2019-07-30 MED ORDER — BUPIVACAINE HCL (PF) 0.5 % IJ SOLN
INTRAMUSCULAR | Status: DC | PRN
  Administered 2019-07-30: 18 mL

## 2019-07-30 MED ORDER — MORPHINE SULFATE (PF) 2 MG/ML IV SOLN
2.0000 mg | Freq: Once | INTRAVENOUS | Status: AC
  Administered 2019-07-30: 22:00:00 2 mg via INTRAVENOUS
  Filled 2019-07-30: qty 1

## 2019-07-30 MED ORDER — LIDOCAINE 2% (20 MG/ML) 5 ML SYRINGE
INTRAMUSCULAR | Status: AC
  Filled 2019-07-30: qty 5

## 2019-07-30 MED ORDER — HYDROMORPHONE HCL 1 MG/ML IJ SOLN: 0.2500 mg | INTRAMUSCULAR | Status: DC | PRN

## 2019-07-30 MED ORDER — LACTATED RINGERS IV SOLN
INTRAVENOUS | Status: DC | PRN
  Administered 2019-07-30: 15:00:00 via INTRAVENOUS

## 2019-07-30 MED ORDER — MIDAZOLAM HCL 5 MG/5ML IJ SOLN
INTRAMUSCULAR | Status: DC | PRN
  Administered 2019-07-30 (×2): 1 mg via INTRAVENOUS

## 2019-07-30 MED ORDER — LIDOCAINE HCL 1 % IJ SOLN
INTRAMUSCULAR | Status: DC | PRN
  Administered 2019-07-30: 60 mg via INTRADERMAL

## 2019-07-30 MED ORDER — ACETAMINOPHEN 10 MG/ML IV SOLN: 1000.0000 mg | Freq: Once | INTRAVENOUS | Status: DC | PRN

## 2019-07-30 MED ORDER — 0.9 % SODIUM CHLORIDE (POUR BTL) OPTIME
TOPICAL | Status: DC | PRN
  Administered 2019-07-30: 1000 mL

## 2019-07-30 MED ORDER — LACTATED RINGERS IV SOLN
INTRAVENOUS | Status: DC
  Administered 2019-07-30: 14:00:00 via INTRAVENOUS

## 2019-07-30 MED ORDER — PROMETHAZINE HCL 25 MG/ML IJ SOLN: 6.2500 mg | INTRAMUSCULAR | Status: DC | PRN

## 2019-07-30 MED ORDER — BUPIVACAINE HCL (PF) 0.5 % IJ SOLN
INTRAMUSCULAR | Status: AC
  Filled 2019-07-30: qty 30

## 2019-07-30 SURGICAL SUPPLY — 29 items
BAG ZIPLOCK 12X15 (MISCELLANEOUS) ×3 IMPLANT
BLADE SURG SZ10 CARB STEEL (BLADE) ×3 IMPLANT
BNDG COHESIVE 4X5 TAN STRL (GAUZE/BANDAGES/DRESSINGS) ×3 IMPLANT
BNDG ELASTIC 4X5.8 VLCR STR LF (GAUZE/BANDAGES/DRESSINGS) ×2 IMPLANT
BNDG GAUZE ELAST 4 BULKY (GAUZE/BANDAGES/DRESSINGS) ×3 IMPLANT
COVER SURGICAL LIGHT HANDLE (MISCELLANEOUS) ×3 IMPLANT
COVER WAND RF STERILE (DRAPES) IMPLANT
DRAPE U-SHAPE 47X51 STRL (DRAPES) ×3 IMPLANT
DRSG EMULSION OIL 3X16 NADH (GAUZE/BANDAGES/DRESSINGS) ×3 IMPLANT
DRSG PAD ABDOMINAL 8X10 ST (GAUZE/BANDAGES/DRESSINGS) ×3 IMPLANT
DURAPREP 26ML APPLICATOR (WOUND CARE) ×3 IMPLANT
ELECT REM PT RETURN 15FT ADLT (MISCELLANEOUS) ×3 IMPLANT
GAUZE SPONGE 4X4 12PLY STRL (GAUZE/BANDAGES/DRESSINGS) ×2 IMPLANT
GAUZE XEROFORM 1X8 LF (GAUZE/BANDAGES/DRESSINGS) ×2 IMPLANT
GLOVE BIOGEL PI IND STRL 8.5 (GLOVE) ×1 IMPLANT
GLOVE BIOGEL PI INDICATOR 8.5 (GLOVE) ×2
GLOVE SURG ORTHO 9.0 STRL STRW (GLOVE) ×3 IMPLANT
KIT BASIN OR (CUSTOM PROCEDURE TRAY) ×3 IMPLANT
KIT TURNOVER KIT A (KITS) IMPLANT
MANIFOLD NEPTUNE II (INSTRUMENTS) ×3 IMPLANT
NS IRRIG 1000ML POUR BTL (IV SOLUTION) ×3 IMPLANT
PACK ORTHO EXTREMITY (CUSTOM PROCEDURE TRAY) ×3 IMPLANT
PROTECTOR NERVE ULNAR (MISCELLANEOUS) ×3 IMPLANT
SUCTION FRAZIER HANDLE 12FR (TUBING) ×2
SUCTION TUBE FRAZIER 12FR DISP (TUBING) ×1 IMPLANT
SUT ETHILON 2 0 PSLX (SUTURE) ×6 IMPLANT
SWAB COLLECTION DEVICE MRSA (MISCELLANEOUS) ×3 IMPLANT
SWAB CULTURE ESWAB REG 1ML (MISCELLANEOUS) ×3 IMPLANT
WATER STERILE IRR 1000ML POUR (IV SOLUTION) ×3 IMPLANT

## 2019-07-30 NOTE — Anesthesia Procedure Notes (Signed)
Procedure Name: MAC Date/Time: 07/30/2019 2:44 PM Performed by: Eben Burow, CRNA Pre-anesthesia Checklist: Patient identified, Emergency Drugs available, Suction available, Patient being monitored and Timeout performed Oxygen Delivery Method: Simple face mask Dental Injury: Teeth and Oropharynx as per pre-operative assessment

## 2019-07-30 NOTE — Op Note (Signed)
  Courtney Fenlon male 63 y.o. 07/30/2019  PreOperative Diagnosis: Left second toe diabetic ulceration with underlying osteomyelitis  PostOperative Diagnosis: Same  PROCEDURE: Left second toe partial amputation  SURGEON: Melony Overly, MD  ASSISTANT: None  ANESTHESIA: MAC with digital blockade using half percent Marcaine  FINDINGS: Left second toe diabetic infection with underlying osteomyelitis  IMPLANTS: None  INDICATIONS:63 y.o. male with uncontrolled diabetes and a hemoglobin A1c of 11.6 he recently had a hallux amputation at the [facility] presented emergency department with continued drainage and swelling of the second toe.  X-rays in the emergency department were read as osteomyelitis.  On evaluation he had sloughing of the tip of his toe.  The initial tissue was debrided at the bedside but he had continued drainage with foul odor and therefore was indicated for partial toe amputation.  We discussed the risk, benefits alternatives of surgery which include but are not limited to wound healing complications, continued infection, need for further surgery, demonstrating structures and development of pain.  After weighing these risks he opted proceed with surgery.  PROCEDURE: Patient was identified in the preoperative holding area.  The left foot was marked myself the consent was signed myself and the patient.  He was taken the operative suite placed supine the operative table.  MAC anesthesia was induced no difficulty.  Then 20 cc of half percent Marcaine plain were injected in a digital blockade for the second toe without difficulty.  Then the left lower extremity was prepped and draped in usual sterile fashion.  A bump was placed on the left hip prior to draping and all bony prominences were well-padded.  We began by making an incision circumferentially around the toe down to bone.  The toe was initially disarticulated at the PIP joint but there was an adequate soft tissue coverage and  therefore after dissection of the soft tissue off the proximal phalanx a bone cutter was used to cut the exposed bone portion.  Then there was enough tissue to close over the top.  The wound was irrigated copiously.  Then traction tenotomy was performed of the flexor digitorum longus tendon.  Then the surrounding cystic soft tissues were inspected and found to be viable.  There was minimal bleeding at the amputation site during the procedure.  No tourniquet was used.  The knee tissue was closed with a 3-0 Monocryl deep to invert the tissue and a 2-0 nylon stitch.  There was no overt tension on the closure.  The distal aspect of the toe was sent for culture and the proximal margin of the proximal phalanx was sent for pathology.  Soft dressing including Xeroform, 4 x 4's, Ace wrap were placed.  He was awakened from anesthesia and taken recovery in stable condition.  Tolerated the procedure well.  No complications.  POST OPERATIVE INSTRUCTIONS: Heel weightbearing to left lower extremity Keep dressing in place until follow-up He will follow-up with me in 2 weeks for wound check Antibiotics per medicine team given ankle and distal leg cellulitis   TOURNIQUET TIME: No tourniquet was used  BLOOD LOSS:  Minimal         DRAINS: none         SPECIMEN: none       COMPLICATIONS:  * No complications entered in OR log *         Disposition: PACU - hemodynamically stable.         Condition: stable

## 2019-07-30 NOTE — Anesthesia Postprocedure Evaluation (Signed)
Anesthesia Post Note  Patient: Darius Bond  Procedure(s) Performed: LEFT SECOND TOE AMPUTATION (Left Toe)     Patient location during evaluation: PACU Anesthesia Type: MAC Level of consciousness: sedated Pain management: pain level controlled Vital Signs Assessment: post-procedure vital signs reviewed and stable Respiratory status: spontaneous breathing Cardiovascular status: stable Postop Assessment: no apparent nausea or vomiting Anesthetic complications: no    Last Vitals:  Vitals:   07/30/19 1600 07/30/19 1622  BP: (!) 150/83 (!) 164/88  Pulse:  76  Resp:  16  Temp: 36.8 C 36.4 C  SpO2:  100%    Last Pain:  Vitals:   07/30/19 1622  TempSrc: Oral  PainSc:    Pain Goal: Patients Stated Pain Goal: 3 (07/30/19 1405)    LLE Sensation: Decreased (07/30/19 1545)         Epidural/Spinal Function Cutaneous sensation: Able to Wiggle Toes (07/30/19 1600), Patient able to flex knees: Yes (07/30/19 1600)  Huston Foley

## 2019-07-30 NOTE — Progress Notes (Signed)
PROGRESS NOTE  Darius Bond [id number] DOB: Sep 11, 1956 DOA: 07/28/2019 [facility]  Brief History   63 year old African-American male former veteran currently homeless staying at homeless shelter recent visit to emergency room 8/21 mild AKI COVID negative Known PAD followed at [facility] Dr. Lennette Bihari Chang-prior cocaine abuse,  CAD status post PCI ~ 2015 (sounds like he had a coronary event-no other information is available given he got this done in [place] at the [place]),  diabetes mellitus with only moderate control A1c 8.9--01/2019  Recent ABIs right side 0.7 left side 0.8  Started having left toe ulceration 6 weeks prior to 03/25/2019 followed with podiatrist in [place] to have osteomyelitis recommended partial hallux amputation at that time  Returns to emergency room with worsening foot wound  On admit sodium 131 BUN/creatinine 26/1.6 lactic acid 0.8-hemoglobin 10.7 WBC 14.1 predominant neutrophilia--- foot x-ray left side revealed osteomyelitis second toe with particular erosion distal phalanx  Dr. Lucia Gaskins of orthopedics consulted-going for surgery on 8/28  A & P  Osteomyelitis second toe Zosyn/vancomycin, surgery later today Tylenol first choice, tramadol every 6 as needed severe pain-continue Robaxin CAD status post PCI 2015 HTN Compensated probable diastolic heart failure Habitus for sleep apnea Continue amlodipine 5 Coreg 12.5 twice daily Zocor 20 daily--blood pressure at this time is moderately controlled Holding HCTZ 25 at this time DM TY 2 A1c 11.6/nephropathy/neuropathy Peripheral arterial disease in the setting of prior smoking use--recent ABIs as above Metabolic syndrome Home meds Lantus 12--also continuing linagliptin 5 Sugar 1 28-1 51-lantus to 18 units--Lyrica 75 twice daily Chronic kidney disease stage II-III Secondary to poorly controlled diabetes HTN etc.-monitor trends continue saline 50 cc preop-labs a.m. BMI  69 Requires medically supervised weight loss may consider [facility]  DVT prophylaxis: Lovenox Code Status: Full Family Communication: None Disposition Plan: Inpatient pending orthopedic input   Verneita Griffes, MD [facility] 11:29 AM  07/30/2019, 11:29 AM  LOS: 2 days   Consultants  . Dr. Lucia Gaskins of orthopedics  Procedures  . None yet  Antibiotics  . [name] as above  Interval History/Subjective   Much more coherent Significant swelling to left lower extremity No nausea no vomiting Ready for surgery  Objective   Vitals:  Vitals:   07/30/19 0341 07/30/19 1107  BP: 131/68 (!) 161/76  Pulse: 62 69  Resp: 18   Temp: 97.9 F (36.6 C)   SpO2: 99%     Exam:  Awake coherent no distress sitting up cheerful Decreased air entry to left hemithorax posterior laterally however no crackles no rales Chest clear anteriorly S1-S2 no murmur Left lower extremity is significantly swollen although I do not palpate a cord Wound not examined today   I have personally reviewed the following:   Today's Data   Lab Data  .   Micro Data  .   Imaging  . Ultrasound of kidneys on 8/20 7 AM shows unremarkable study  Cardiology Data  .   Other Data  .   Scheduled Meds: . amLODipine  5 mg Oral Daily  . aspirin  81 mg Oral Daily  . carvedilol  12.5 mg Oral BID  . enoxaparin (LOVENOX) injection  40 mg Subcutaneous Q24H  . FLUoxetine  60 mg Oral Daily  . hydrOXYzine  25 mg Oral BID  . insulin aspart  0-5 Units Subcutaneous QHS  . insulin aspart  0-9 Units Subcutaneous TID WC  . insulin glargine  18 Units Subcutaneous QHS  . latanoprost  1  drop Both Eyes QHS  . linagliptin  5 mg Oral Daily  . Melatonin  6 mg Oral QHS  . pregabalin  75 mg Oral BID  . simvastatin  20 mg Oral Daily  . tamsulosin  0.4 mg Oral QHS  . traZODone  200 mg Oral QHS   Continuous Infusions: . sodium chloride 50 mL/hr at 07/30/19 0607  . sodium chloride 10 mL/hr at 07/30/19  [id number]  . piperacillin-tazobactam (ZOSYN)  IV 3.375 g (07/30/19 0616)  . vancomycin 1,000 mg (07/29/19 1443)    Active Problems:   Osteomyelitis (HCC)   AKI (acute kidney injury) (HCC)   Essential hypertension   Type 2 diabetes mellitus with foot ulcer (HCC)   LOS: 2 days   How to contact the [facility] Attending or Consulting provider 7A - 7P or covering provider during after hours 7P -7A, for this patient?  1. Check the care team in [facility] and look for a) attending/consulting TRH provider listed and b) the [facility] team listed 2. Log into [email or web address] and use [place]'s universal password to access. If you do not have the password, please contact the hospital operator. 3. Locate the [facility] provider you are looking for under [facility] and page to a number that you can be directly reached. 4. If you still have difficulty reaching the provider, please page the [facility] (Director on Call) for the Hospitalists listed on amion for assistance.

## 2019-07-30 NOTE — Care Management Important Message (Signed)
Important Message  Patient Details IM Letter given to Velva Harman RN to present to the Patient Name: Darius Bond MRN: [id number] Date of Birth: [birth year]   Medicare Important Message Given:  Yes     Kerin Salen 07/30/2019, 12:24 PM

## 2019-07-30 NOTE — Plan of Care (Signed)
  Problem: Education: Goal: Knowledge of General Education information will improve Description: Including pain rating scale, medication(s)/side effects and non-pharmacologic comfort measures Outcome: Progressing   Problem: Clinical Measurements: Goal: Respiratory complications will improve Outcome: Progressing   Problem: Clinical Measurements: Goal: Cardiovascular complication will be avoided Outcome: Progressing   Problem: Activity: Goal: Risk for activity intolerance will decrease Outcome: Progressing   Problem: Coping: Goal: Level of anxiety will decrease Outcome: Progressing   

## 2019-07-30 NOTE — Progress Notes (Signed)
Pharmacy Antibiotic Note  Darius Bond is a 63 y.o. male admitted on 07/28/2019 with osteomyelitis.  Pharmacy has been consulted for vancomycin and piperacillin/tazobactam dosing.  Pt admitted with left foot wound. PMH significant for DM as well as OM resulting in amputation of left hallux in May 2020.  Today, 07/30/2019:  Afebrile, WBC improved to WNL on IV abx  SCr stable ~ 1.6, baseline unknown  Despite improvement with conservative measures, patient has opted for amputation of L 2nd toe d/t homelessness, lack of social support, and lack of ability to maintain close follow-up  Plan:  Continue Zosyn 3.375 g IV q8 hr by extended infusion  Continue vancomycin 1000 mg IV q24h with stable renal function (AUC 475 with SCr 1.6, Vd 0.5)  Follow renal function and culture data  Check vancomycin levels once at steady state if indicated  Height: 5\' 3"  (160 cm) Weight: 268 lb (121.6 kg) IBW/kg (Calculated) : 56.9  Temp (24hrs), Avg:97.9 F (36.6 C), Min:97.7 F (36.5 C), Max:98.2 F (36.8 C)  Recent Labs  Lab 07/28/19 1325 07/29/19 0240 07/30/19 0232  WBC 14.1* 9.8  --   CREATININE 1.64* 1.68* 1.59*  LATICACIDVEN 0.8  --   --     Estimated Creatinine Clearance: 55 mL/min (A) (by C-G formula based on SCr of 1.59 mg/dL (H)).    Allergies  Allergen Reactions  . Glipizide Swelling    Tongue swelling    Antimicrobials this admission: Piperacillin/tazobactam 8/26 >>  vancomycin 8/26 >>   Dose adjustments this admission: n/a  Microbiology results: 8/26 BCx: ngtd 8/26 SARS-2: neg  Thank you for allowing pharmacy to be a part of this patient's care.  Reuel Boom, PharmD, BCPS [phone] 07/30/2019, 11:53 AM

## 2019-07-30 NOTE — Transfer of Care (Signed)
Immediate Anesthesia Transfer of Care Note  Patient: Darius Bond  Procedure(s) Performed: LEFT SECOND TOE AMPUTATION (Left Toe)  Patient Location: PACU  Anesthesia Type:MAC  Level of Consciousness: awake, alert  and oriented  Airway & Oxygen Therapy: Patient Spontanous Breathing and Patient connected to face mask oxygen  Post-op Assessment: Report given to RN and Post -op Vital signs reviewed and stable  Post vital signs: Reviewed and stable  Last Vitals:  Vitals Value Taken Time  BP    Temp    Pulse    Resp    SpO2      Last Pain:  Vitals:   07/30/19 1405  TempSrc: Oral  PainSc: 5       Patients Stated Pain Goal: 3 (58/83/25 4982)  Complications: No apparent anesthesia complications

## 2019-07-30 NOTE — Progress Notes (Addendum)
Darius Bond is a 63 y.o. male   Orthopaedic diagnosis: Left second toe diabetic infection  Subjective: Patient is resting comfortably today.  He denies any pain in his left foot.  Denies any fevers or chills.  On questioning patient states he ended up in [place] because he is homeless.  He is not from the area.  He has minimal social support.  Objectyive: Vitals:   07/29/19 2134 07/30/19 0341  BP: 124/71 131/68  Pulse: 73 62  Resp: 16 18  Temp: 98.2 F (36.8 C) 97.9 F (36.6 C)  SpO2: 100% 99%     Exam: Awake and alert Respirations even and unlabored No acute distress  Left foot demonstrates overall improvement in the second toe after removal of the overlying necrotic skin.  No exposed bone.  Some spotty bleeding but no overt purulent drainage.  Swelling continues.  He still has swelling in his foot and his ankle.  Baseline sensory deficits due to neuropathy about the foot.  Foot is warm to touch and appears well-perfused.  Assessment: Left second toe diabetic infection in the setting of uncontrolled diabetes with hyperglycemia and A1c of 11.6   Plan: I had a lengthy conversation with the patient today.  He did have some slight improvement in his toe with antibiotic treatment and debridement of the soft tissues.  He has some continued swelling but less drainage.  No exposed bone.  I gave the option to the patient to continue with conservative treatment in the hopes that the toe will heal uneventfully with antibiotic treatment and good diabetic control versus toe amputation to eradicate the infection definitively.  Because the patient has limited social support and would have difficulty maintaining close follow-up he opted to proceed with surgery.  I will speak with the OR about surgical timing which may be later this afternoon versus tomorrow morning.    Patient understands the risk, benefits and alternatives of surgery which were discussed with him.  The risks include but are  not limited to wound healing complications, continued infection, need for further surgery, damage to surrounding structures as well as increased pain.  Patient understands that his risks of surgical intervention are increased due to the diabetes.  He wishes to proceed with surgery.  Patient will remain n.p.o. until definitive surgical time is established.  Plan is for surgery today.  Please keep patient NPO.   Darius Journey, MD

## 2019-07-30 NOTE — Anesthesia Preprocedure Evaluation (Addendum)
Anesthesia Evaluation  Patient identified by MRN, date of birth, ID band Patient awake    Reviewed: Allergy & Precautions, NPO status , Patient's Chart, lab work & pertinent test results, reviewed documented beta blocker date and time   Airway Mallampati: I       Dental  (+) Missing, Poor Dentition   Pulmonary Current Smoker and Patient abstained from smoking.,    Pulmonary exam normal        Cardiovascular hypertension, Pt. on medications and Pt. on home beta blockers Normal cardiovascular exam Rhythm:Regular Rate:Normal     Neuro/Psych PSYCHIATRIC DISORDERS Depression    GI/Hepatic Neg liver ROS,   Endo/Other  diabetes, Type 2, Insulin DependentMorbid obesity  Renal/GU Renal diseaseAKI     Musculoskeletal   Abdominal (+) + obese,   Peds  Hematology  (+) Blood dyscrasia, anemia ,   Anesthesia Other Findings   Reproductive/Obstetrics                            Anesthesia Physical Anesthesia Plan  ASA: III  Anesthesia Plan: MAC   Post-op Pain Management:    Induction:   PONV Risk Score and Plan: 1 and Ondansetron  Airway Management Planned: Nasal Cannula and Natural Airway  Additional Equipment:   Intra-op Plan:   Post-operative Plan:   Informed Consent: I have reviewed the patients History and Physical, chart, labs and discussed the procedure including the risks, benefits and alternatives for the proposed anesthesia with the patient or authorized representative who has indicated his/her understanding and acceptance.       Plan Discussed with: CRNA  Anesthesia Plan Comments:        Anesthesia Quick Evaluation

## 2019-07-31 ENCOUNTER — Encounter (HOSPITAL_COMMUNITY): Payer: Self-pay | Admitting: Orthopaedic Surgery

## 2019-07-31 LAB — BASIC METABOLIC PANEL
Anion gap: 5 (ref 5–15)
BUN: 18 mg/dL (ref 8–23)
CO2: 24 mmol/L (ref 22–32)
Calcium: 8 mg/dL — ABNORMAL LOW (ref 8.9–10.3)
Chloride: 105 mmol/L (ref 98–111)
Creatinine, Ser: 1.51 mg/dL — ABNORMAL HIGH (ref 0.61–1.24)
GFR calc Af Amer: 56 mL/min — ABNORMAL LOW (ref >60)
GFR calc non Af Amer: 48 mL/min — ABNORMAL LOW (ref >60)
Glucose, Bld: 152 mg/dL — ABNORMAL HIGH (ref 70–99)
Potassium: 4.3 mmol/L (ref 3.5–5.1)
Sodium: 134 mmol/L — ABNORMAL LOW (ref 135–145)

## 2019-07-31 LAB — CBC WITH DIFFERENTIAL/PLATELET
Abs Immature Granulocytes: 0.11 10*3/uL — ABNORMAL HIGH (ref 0.00–0.07)
Basophils Absolute: 0.1 10*3/uL (ref 0.0–0.1)
Basophils Relative: 0 %
Eosinophils Absolute: 1.3 10*3/uL — ABNORMAL HIGH (ref 0.0–0.5)
Eosinophils Relative: 11 %
HCT: 32.1 % — ABNORMAL LOW (ref 39.0–52.0)
Hemoglobin: 10.5 g/dL — ABNORMAL LOW (ref 13.0–17.0)
Immature Granulocytes: 1 %
Lymphocytes Relative: 12 %
Lymphs Abs: 1.5 10*3/uL (ref 0.7–4.0)
MCH: 26.7 pg (ref 26.0–34.0)
MCHC: 32.7 g/dL (ref 30.0–36.0)
MCV: 81.7 fL (ref 80.0–100.0)
Monocytes Absolute: 0.9 10*3/uL (ref 0.1–1.0)
Monocytes Relative: 7 %
Neutro Abs: 8.1 10*3/uL — ABNORMAL HIGH (ref 1.7–7.7)
Neutrophils Relative %: 69 %
Platelets: 370 10*3/uL (ref 150–400)
RBC: 3.93 MIL/uL — ABNORMAL LOW (ref 4.22–5.81)
RDW: 13.7 % (ref 11.5–15.5)
WBC: 12 10*3/uL — ABNORMAL HIGH (ref 4.0–10.5)
nRBC: 0 % (ref 0.0–0.2)

## 2019-07-31 LAB — GLUCOSE, CAPILLARY
Glucose-Capillary: 110 mg/dL — ABNORMAL HIGH (ref 70–99)
Glucose-Capillary: 197 mg/dL — ABNORMAL HIGH (ref 70–99)
Glucose-Capillary: 173 mg/dL — ABNORMAL HIGH (ref 70–99)
Glucose-Capillary: 153 mg/dL — ABNORMAL HIGH (ref 70–99)

## 2019-07-31 MED ORDER — TRAMADOL HCL 50 MG PO TABS
100.0000 mg | ORAL_TABLET | Freq: Four times a day (QID) | ORAL | Status: DC | PRN
  Administered 2019-07-31 – 2019-08-01 (×2): 100 mg via ORAL
  Filled 2019-07-31 (×2): qty 2

## 2019-07-31 MED ORDER — AMLODIPINE BESYLATE 10 MG PO TABS
10.0000 mg | ORAL_TABLET | Freq: Every day | ORAL | Status: DC
  Administered 2019-07-31 – 2019-08-01 (×2): 10 mg via ORAL
  Filled 2019-07-31 (×2): qty 1

## 2019-07-31 NOTE — Progress Notes (Signed)
PROGRESS NOTE  Darius Bond [id number] DOB: 04-Jul-1956 DOA: 07/28/2019 PCP: [facility]  Brief History   [age] [place] [facility] recent visit to emergency room 8/21 mild AKI COVID negative [facility] Caryn BeeKevin Bond-prior cocaine abuse,  CAD status post PCI ~ 2015 (sounds like he had a coronary event-no other information is available given [place] [facility]),  diabetes mellitus with only moderate control A1c 8.9--01/2019  Recent ABIs right side 0.7 left side 0.8  Started having left toe ulceration 6 weeks prior to 03/25/2019 [place] [facility] [place]-found to have osteomyelitis recommended partial hallux amputation at that time  Admitted with osteomyelitis septic left second toe 8/26 Orthopedics performed left second toe partial amputation 8/28   A & P  Osteomyelitis second toe Continue broad-spectrum Vanco Zosyn-narrow antibiotics based on deep wound cultures in the next day or so Increase tramadol from 50-->100 every 6 as needed for severe pain, first choice Tylenol CAD status post PCI 2015 HTN Compensated probable diastolic heart failure Habitus for sleep apnea  Coreg 12.5 twice daily Zocor 20 daily--poorly controlled at times-titrate amlodipine to 10-HCTZ DC this admit DM TY 2 A1c 11.6/nephropathy/neuropathy Peripheral arterial disease in the setting of prior smoking use--recent ABIs as above Metabolic syndrome Home meds Lantus 12--also continuing linagliptin 5 lantus to 18 units--Lyrica 75 twice daily--CBG 1 10-1 50 Chronic kidney disease stage II-III Secondary to poorly controlled diabetes HTN etc.-DC IV fluid 8/29 BMI 44 Requires medically supervised weight loss [facility]  DVT prophylaxis: Lovenox Code Status: Full Family Communication: None Disposition Plan: Once cultures are finalized will probably narrow  antibiotics to oral meds for about 2 weeks to 4 weeks dependent on findings  Darius KochJai Sandrine Bloodsworth, MD [facility] 9:12 AM  07/31/2019, 9:12 AM  LOS: 3 days   Consultants  . Dr. Susa SimmondsAdair of orthopedics  Procedures  . None yet  Antibiotics  . [name] as above  Interval History/Subjective   Sleepy this morning had breakfast Received morphine overnight Pain is controlled-tells me he can manage on oral meds and I encouraged him to do so  Objective   Vitals:  Vitals:   07/31/19 0133 07/31/19 0510  BP: 120/66 (!) 130/112  Pulse: 71 66  Resp: 18 20  Temp: 98 F (36.7 C) 98.3 F (36.8 C)  SpO2: 94% 98%    Exam:  Awake coherent no distress sitting up cheerful Decreased air entry to left hemithorax posterior laterally however no crackles no rales Chest clear anteriorly S1-S2 no murmur Left lower extremity is significantly swollen although I do not palpate a cord Wound not examined today   I have personally reviewed the following:   Today's Data   Lab Data  .   Micro Data  .   Imaging  . Ultrasound of kidneys on 8/20 7 AM shows unremarkable study  Cardiology Data  .   Other Data  .   Scheduled Meds: . amLODipine  5 mg Oral Daily  . aspirin  81 mg Oral Daily  . carvedilol  12.5 mg Oral BID  . enoxaparin (LOVENOX) injection  40 mg Subcutaneous Q24H  . FLUoxetine  60 mg Oral Daily  . hydrOXYzine  25 mg Oral BID  . insulin aspart  0-5 Units Subcutaneous QHS  . insulin aspart  0-9 Units Subcutaneous TID WC  . insulin glargine  18 Units Subcutaneous QHS  . latanoprost  1 drop Both Eyes QHS  . linagliptin  5 mg Oral Daily  . Melatonin  6 mg Oral QHS  . pregabalin  75 mg Oral BID  . simvastatin  20 mg Oral Daily  . tamsulosin  0.4 mg Oral QHS  . traZODone  200 mg Oral QHS   Continuous Infusions: . sodium chloride 50 mL/hr at 07/30/19 1610  . sodium chloride 10 mL/hr at 07/30/19 6834  . lactated ringers 100 mL/hr at 07/30/19 1417  .  piperacillin-tazobactam (ZOSYN)  IV 3.375 g (07/31/19 0548)  . vancomycin 1,000 mg (07/29/19 1443)    Active Problems:   Osteomyelitis (HCC)   AKI (acute kidney injury) ([place])   Essential hypertension   Type 2 diabetes mellitus with foot ulcer ([place])   LOS: 3 days   How to contact the [facility] Attending or Consulting provider [place] or covering provider during after hours [place], for this patient?  1. Check the care team in [facility] and look for a) attending/consulting [facility] provider listed and b) the [facility] team listed 2. Log into [email or web address] and use [place]'s universal password to access. If you do not have the password, please contact the hospital operator. 3. Locate the [facility] provider you are looking for under [facility] and page to a number that you can be directly reached. 4. If you still have difficulty reaching the provider, please page the [facility] (Director on Call) for the Hospitalists listed on amion for assistance.

## 2019-07-31 NOTE — Progress Notes (Signed)
Darius Bond is a 63 y.o. male   Orthopaedic diagnosis: Status post left second toe partial amputation due to diabetic infection  Subjective: Patient is doing well.  No complaints this morning.  Pain is controlled.  Denies fevers or chills.  Objectyive: Vitals:   07/31/19 0133 07/31/19 0510  BP: 120/66 (!) 130/112  Pulse: 71 66  Resp: 18 20  Temp: 98 F (36.7 C) 98.3 F (36.8 C)  SpO2: 94% 98%     Exam: Awake and alert Respirations even and unlabored No acute distress  Left foot with dressing in place.  No drainage noted.  Continued swelling about the distal leg.  This is improving.  Baseline sensory deficits.  Able to dorsiflex and plantarflex the ankle.  No tenderness palpation about the knee.  Assessment: Postop day 1 status post left second toe partial amputation due to diabetic infection   Plan: We sent culture and pathology yesterday from surgery.  Awaiting results. Patient is okay to discharge from orthopedic standpoint once cleared by medical team and obtains appropriate antibiotic therapy. Patient will follow-up with me in 2 weeks for wound check. Patient is heel weightbearing with a postoperative shoe.   [name], MD

## 2019-08-01 LAB — GLUCOSE, CAPILLARY
Glucose-Capillary: 168 mg/dL — ABNORMAL HIGH (ref 70–99)
Glucose-Capillary: 191 mg/dL — ABNORMAL HIGH (ref 70–99)

## 2019-08-01 MED ORDER — PREGABALIN 75 MG PO CAPS: 75.0000 mg | ORAL_CAPSULE | Freq: Two times a day (BID) | ORAL | 0 refills | Status: AC

## 2019-08-01 MED ORDER — AMLODIPINE BESYLATE 10 MG PO TABS: 10.0000 mg | ORAL_TABLET | Freq: Every day | ORAL | 0 refills | Status: AC

## 2019-08-01 MED ORDER — INSULIN GLARGINE 100 UNIT/ML ~~LOC~~ SOLN: 18.0000 [IU] | Freq: Every day | SUBCUTANEOUS | 11 refills | Status: AC

## 2019-08-01 MED ORDER — HYDROCODONE-ACETAMINOPHEN 5-325 MG PO TABS: 1.0000 | ORAL_TABLET | ORAL | 0 refills | Status: AC | PRN

## 2019-08-01 MED ORDER — SULFAMETHOXAZOLE-TRIMETHOPRIM 800-160 MG PO TABS: 1.0000 | ORAL_TABLET | Freq: Two times a day (BID) | ORAL | 0 refills | Status: AC

## 2019-08-01 MED ORDER — CLINDAMYCIN HCL 300 MG PO CAPS: 300.0000 mg | ORAL_CAPSULE | Freq: Three times a day (TID) | ORAL | 0 refills | Status: DC

## 2019-08-01 MED ORDER — CLINDAMYCIN HCL 300 MG PO CAPS
300.0000 mg | ORAL_CAPSULE | Freq: Three times a day (TID) | ORAL | Status: DC
  Administered 2019-08-01: 10:00:00 300 mg via ORAL
  Filled 2019-08-01 (×3): qty 1

## 2019-08-01 NOTE — Progress Notes (Signed)
Pt stable with no needs at time of d/c. Pt had no question on d/c education and instructions given.

## 2019-08-01 NOTE — Plan of Care (Signed)
Pt to d/c home.  

## 2019-08-01 NOTE — Plan of Care (Signed)
Pt to dc home today. No needs this am at time of assessment.

## 2019-08-01 NOTE — Discharge Summary (Addendum)
Physician Discharge Summary  Darius HeckBradley Bond RUE:[id number] DOB: 1956/11/20 DOA: 07/28/2019  [facility]  Admit date: 07/28/2019 Discharge date: 08/01/2019  Time spent: 40 minutes  Recommendations for Outpatient Follow-up:  1. New prescription Norco, bactrim DS given on discharge to complete 2. Needs outpatient A1c in 3 to 4 weeks 3. Needs close follow-up with Dr. Susa SimmondsAdair of orthopedics  Discharge Diagnoses:  Active Problems:   Osteomyelitis (HCC)   AKI (acute kidney injury) (HCC)   Essential hypertension   Type 2 diabetes mellitus with foot ulcer (HCC)   Discharge Condition: Improved  Diet recommendation: Diabetic heart healthy  Filed Weights   07/28/19 1321  Weight: 121.6 kg    History of present illness:  [id number] black [facility] recent visit to emergency room 8/21 mild AKI COVID negative Known [facility] Caryn BeeKevin Bond-prior cocaine abuse,  CAD status post PCI ~ 2015 (sounds like he had a coronary event-no other information is available given [place] [facility]),  diabetes mellitus with only moderate control A1c 8.9--01/2019  Recent ABIs right side 0.7 left side 0.8  Started having left toe ulceration 6 weeks prior to 03/25/2019 [place] [facility] [place] [facility]-found to have osteomyelitis recommended partial hallux amputation at that time  Admitted with osteomyelitis septic left second toe 8/26 Orthopedics performed left second toe partial amputation 8/28  Hospital Course:  Osteomyelitis second toe status post amputation 8/28 Darius Bond-->bactrim DS completion 08/11/2019 Tramadol for pain control changed to Norco on discharge prescription given CAD status post PCI 2015 HTN Compensated probable diastolic heart failure Habitus for sleep apnea  Coreg 12.5 twice daily Zocor 20 daily--poorly controlled at times-titrate amlodipine to 10-HCTZ DC this admit DM TY  2 A1c 11.6/nephropathy/neuropathy Peripheral arterial disease in the setting of prior smoking use--recent ABIs as above Metabolic syndrome Home meds Lantus 12--linagliptin 5 Increased Lantus 18 units--Lyrica 75 twice daily--CBG are better controlled he will need good follow-up on discharge Chronic kidney disease stage II-III Secondary to poorly controlled diabetes HTN etc.-DC IV fluid 8/29 BMI 44 Requires medically supervised weight loss [facility]  Discharge Exam: Vitals:   07/31/19 2205 08/01/19 0506  BP: 136/72 129/70  Pulse: 73 70  Resp: 16 16  Temp: 98.2 F (36.8 C) 97.8 F (36.6 C)  SpO2: 100% 97%    General: Awake coherent pleasant no distress Cardiovascular: S1-S2 no murmur rub or gallop neck soft supple Respiratory: Clear Lower extremity on left side is wrapped with reinforce dressing wound not examined  Discharge Instructions   Discharge Instructions    Diet - low sodium heart healthy   Complete by: As directed    Discharge instructions   Complete by: As directed    Please follow-up with orthopedics Dr. Susa SimmondsAdair in the outpatient setting I have included his contact information so you can be [facility] to ensure that you get to his office within 2 weeks for wound check do not get the area of your surgery wet do not undress the dressing keep your boot on at all times when you are walking around We have prescribed antibiotics for treatment of the bone infection that you had please complete all of them and we have given you limited prescription of pain meds which you can probably transition to only Tylenol in 3 to 5 days No changes to some of your blood sugar meds and your blood pressure meds  Increase activity slowly   Complete by: As directed      Allergies as of 08/01/2019      Reactions   Glipizide Swelling   Tongue swelling      Medication List    STOP taking these medications   hydrochlorothiazide 25 MG  tablet Commonly known as: HYDRODIURIL     TAKE these medications   acetaminophen 500 MG tablet Commonly known as: TYLENOL Take 1 tablet (500 mg total) by mouth every 6 (six) hours as needed. What changed: reasons to take this   Alogliptin Benzoate 25 MG Tabs Take 12.5 mg by mouth daily.   amLODipine 10 MG tablet Commonly known as: NORVASC Take 1 tablet (10 mg total) by mouth daily. What changed:   medication strength  how much to take   aspirin 81 MG chewable tablet Chew 81 mg by mouth daily.   benzonatate 100 MG capsule Commonly known as: TESSALON Take 1 capsule (100 mg total) by mouth every 8 (eight) hours. What changed:   when to take this  reasons to take this   carvedilol 12.5 MG tablet Commonly known as: COREG Take 12.5 mg by mouth 2 (two) times daily.   clindamycin 300 MG capsule Commonly known as: CLEOCIN Take 1 capsule (300 mg total) by mouth every 8 (eight) hours for 9 days.   FLUoxetine 20 MG capsule Commonly known as: PROZAC Take 60 mg by mouth daily.   HYDROcodone-acetaminophen 5-325 MG tablet Commonly known as: NORCO/VICODIN Take 1-2 tablets by mouth every 4 (four) hours as needed for up to 7 days for moderate pain.   hydrOXYzine 25 MG tablet Commonly known as: ATARAX/VISTARIL Take 25 mg by mouth 2 (two) times daily.   insulin glargine 100 UNIT/ML injection Commonly known as: Lantus Inject 0.18 mLs (18 Units total) into the skin at bedtime. What changed: how much to take   latanoprost 0.005 % ophthalmic solution Commonly known as: XALATAN Place 1 drop into both eyes at bedtime.   Melatonin 3 MG Tabs Take 6 mg by mouth at bedtime.   methocarbamol 750 MG tablet Commonly known as: ROBAXIN Take 750 mg by mouth 2 (two) times daily as needed (back pain).   polyvinyl alcohol 1.4 % ophthalmic solution Commonly known as: LIQUIFILM TEARS Place 1 drop into both eyes as needed for dry eyes.   pregabalin 75 MG capsule Commonly known as:  LYRICA Take 1 capsule (75 mg total) by mouth 2 (two) times daily.   simvastatin 20 MG tablet Commonly known as: ZOCOR Take 20 mg by mouth daily.   tamsulosin 0.4 MG Caps capsule Commonly known as: FLOMAX Take 0.4 mg by mouth at bedtime.   traZODone 100 MG tablet Commonly known as: DESYREL Take 200 mg by mouth at bedtime.      Allergies  Allergen Reactions  . Glipizide Swelling    Tongue swelling   Follow-up Information    Terance Hart, MD. Schedule an appointment as soon as possible for a visit in 2 weeks.   Specialty: Orthopedic Surgery Contact information: [address] [place] [address] [phone]            The results of significant diagnostics from this hospitalization (including imaging, microbiology, ancillary and laboratory) are listed below for reference.    Significant Diagnostic Studies: US Renal  Result Date: 07/29/2019 CLINICAL DATA:  63 year old male with acute kidney injury EXAM: RENAL / URINARY TRACT ULTRASOUND COMPLETE COMPARISON:  None. FINDINGS: Right Kidney: Length: 8.6 cm x 5.6 cm  x 6.0 cm, 151 cc. Echogenicity within normal limits. No mass or hydronephrosis visualized. Left Kidney: Length: 11.4 cm x 5.3 cm x 6.4 cm, 199 cc. Echogenicity within normal limits. No mass or hydronephrosis visualized. Bladder: Appears normal for degree of bladder distention. IMPRESSION: Unremarkable sonographic survey of the kidneys, with no hydronephrosis Electronically Signed   By: Gilmer Mor D.O.   On: 07/29/2019 08:46   Dg Chest Portable 1 View  Result Date: 07/23/2019 CLINICAL DATA:  [facility] from [facility], called out for SOB, headache, productive cough (brown and yellow sputum) x 4 days. Facility sent Pt for testing. Requires negative Covid test for placement. Pt has HTN. DM. Current smoker since age 52.*comment was truncated*cough EXAM: PORTABLE CHEST 1 VIEW COMPARISON:  None. FINDINGS: Normal mediastinum and cardiac silhouette. Normal pulmonary  vasculature. No evidence of effusion, infiltrate, or pneumothorax. No acute bony abnormality. IMPRESSION: No acute cardiopulmonary process. Electronically Signed   By: Genevive Bi M.D.   On: 07/23/2019 11:42   Dg Foot Complete Left  Result Date: 07/28/2019 CLINICAL DATA:  Amputation earlier this year. Diabetes. Question second toe osteomyelitis. EXAM: LEFT FOOT - COMPLETE 3+ VIEW COMPARISON:  None. FINDINGS: Previous partial amputation of the great toe with residual proximal phalanx. That bone is slightly irregular and indeterminate for residual infection. There is air/gas in the soft tissues of the second toe with erosion of the distal phalanx consistent with osteomyelitis. Extensive regional arterial calcification is noted. Generalized soft tissue swelling of the foot. IMPRESSION: Osteomyelitis of the second toe with partial erosion of the distal phalanx. Previous amputation of the great toe. Distal bone margin of the phalanx is somewhat irregular which could be normal or reflect residual or recurrent infection in that bone. Electronically Signed   By: Paulina Fusi M.D.   On: 07/28/2019 14:40   Vas [place] Lower Extremity Venous (dvt) 7a-7p  Result Date: 07/29/2019  Lower Venous Study Indications: Swelling.  Risk Factors: None identified. Comparison Study: No prior studies. Performing Technologist: Chanda Busing RVT  Examination Guidelines: A complete evaluation includes B-mode imaging, spectral Doppler, color Doppler, and power Doppler as needed of all accessible portions of each vessel. Bilateral testing is considered an integral part of a complete examination. Limited examinations for reoccurring indications may be performed as noted.  +-----+---------------+---------+-----------+----------+--------------+ RIGHTCompressibilityPhasicitySpontaneityPropertiesThrombus Aging +-----+---------------+---------+-----------+----------+--------------+ CFV  Full           Yes      Yes                                  +-----+---------------+---------+-----------+----------+--------------+   +---------+---------------+---------+-----------+----------+--------------+ LEFT     CompressibilityPhasicitySpontaneityPropertiesThrombus Aging +---------+---------------+---------+-----------+----------+--------------+ CFV      Full           Yes      Yes                                 +---------+---------------+---------+-----------+----------+--------------+ SFJ      Full                                                        +---------+---------------+---------+-----------+----------+--------------+ FV Prox  Full                                                        +---------+---------------+---------+-----------+----------+--------------+  FV Mid   Full                                                        +---------+---------------+---------+-----------+----------+--------------+ FV DistalFull                                                        +---------+---------------+---------+-----------+----------+--------------+ PFV      Full                                                        +---------+---------------+---------+-----------+----------+--------------+ POP      Full           Yes      Yes                                 +---------+---------------+---------+-----------+----------+--------------+ PTV      Full                                                        +---------+---------------+---------+-----------+----------+--------------+ PERO     Full                                                        +---------+---------------+---------+-----------+----------+--------------+     Summary: Right: No evidence of common femoral vein obstruction. Left: There is no evidence of deep vein thrombosis in the lower extremity. No cystic structure found in the popliteal fossa.  *See table(s) above for measurements and observations.  Electronically signed by Harold Barban MD on 07/29/2019 at 7:33:00 AM.    Final     Microbiology: Recent Results (from the past 240 hour(s))  SARS CORONAVIRUS 2 Nasal Swab Aptima Multi Swab     Status: None   Collection Time: 07/23/19 10:37 AM   Specimen: Aptima Multi Swab; Nasal Swab  Result Value Ref Range Status   SARS Coronavirus 2 NEGATIVE NEGATIVE Final    Comment: (NOTE) SARS-CoV-2 target nucleic acids are NOT DETECTED. The SARS-CoV-2 RNA is generally detectable in upper and lower respiratory specimens during the acute phase of infection. Negative results do not preclude SARS-CoV-2 infection, do not rule out co-infections with other pathogens, and should not be used as the sole basis for treatment or other patient management decisions. Negative results must be combined with clinical observations, patient history, and epidemiological information. The expected result is Negative. Fact Sheet for Patients: SugarRoll.be Fact Sheet for Healthcare Providers: https://www.woods-mathews.com/ This test is not yet approved or cleared by the [place] FDA and  has been authorized for detection and/or diagnosis of SARS-CoV-2 by FDA under an Emergency Use Authorization (EUA).  This EUA will remain  in effect (meaning this test can be used) for the duration of the COVID-19 declaration under Section 56 4(b)(1) of the Act, 21 U.S.C. section 360bbb-3(b)(1), unless the authorization is terminated or revoked sooner. Performed at [facility], [address] [place], [place] [address]   Culture, blood (routine x 2)     Status: None (Preliminary result)   Collection Time: 07/28/19  1:25 PM   Specimen: BLOOD  Result Value Ref Range Status   Specimen Description   Final    BLOOD BLOOD LEFT FOREARM Performed at [facility], [address] [place], [place] [address]    Special Requests   Final    BOTTLES DRAWN AEROBIC AND  ANAEROBIC Blood Culture adequate volume Performed at [facility], [address] [place], [place] [address]    Culture   Final    NO GROWTH 4 DAYS Performed at [facility], [address] [place], [place] [address]    Report Status PENDING  Incomplete  SARS CORONAVIRUS 2 (TAT 6-12 HRS) Nasal Swab Aptima Multi Swab     Status: None   Collection Time: 07/28/19  1:27 PM   Specimen: Aptima Multi Swab; Nasal Swab  Result Value Ref Range Status   SARS Coronavirus 2 NEGATIVE NEGATIVE Final    Comment: (NOTE) SARS-CoV-2 target nucleic acids are NOT DETECTED. The SARS-CoV-2 RNA is generally detectable in upper and lower respiratory specimens during the acute phase of infection. Negative results do not preclude SARS-CoV-2 infection, do not rule out co-infections with other pathogens, and should not be used as the sole basis for treatment or other patient management decisions. Negative results must be combined with clinical observations, patient history, and epidemiological information. The expected result is Negative. Fact Sheet for Patients: HairSlick.nohttps://www.fda.gov/media/138098/download Fact Sheet for Healthcare Providers: quierodirigir.comhttps://www.fda.gov/media/138095/download This test is not yet approved or cleared by the [place] FDA and  has been authorized for detection and/or diagnosis of SARS-CoV-2 by FDA under an Emergency Use Authorization (EUA). This EUA will remain  in effect (meaning this test can be used) for the duration of the COVID-19 declaration under Section 56 4(b)(1) of the Act, 21 U.S.C. section 360bbb-3(b)(1), unless the authorization is terminated or revoked sooner. Performed at [facility], [address] [place], [place] [address]   Surgical pcr screen     Status: Abnormal   Collection Time: 07/30/19 12:14 PM   Specimen: Nasal Mucosa; Nasal Swab  Result Value Ref Range Status   MRSA, PCR NEGATIVE NEGATIVE Final   Staphylococcus  aureus POSITIVE (A) NEGATIVE Final    Comment: (NOTE) The Xpert SA Assay (FDA approved for NASAL specimens in patients 64 years of age and older), is one component of a comprehensive surveillance program. It is not intended to diagnose infection nor to guide or monitor treatment. Performed at [facility], [address] [place], [place] [address]   Aerobic/Anaerobic Culture (surgical/deep wound)     Status: None (Preliminary result)   Collection Time: 07/30/19  3:02 PM   Specimen: Toe, Left; Amputation  Result Value Ref Range Status   Specimen Description   Final    TOE LEFT 2ND Performed at [facility], [address] [place], [place] [address]    Special Requests   Final    NONE Performed at [facility], [address] [place], [place] [address]    Gram Stain   Final  ABUNDANT WBC PRESENT, PREDOMINANTLY PMN ABUNDANT GRAM POSITIVE COCCI Performed at [facility], [address] [place] [address] [place], [place] [address]    Culture ABUNDANT STAPHYLOCOCCUS AUREUS  Final   Report Status PENDING  Incomplete     Labs: Basic Metabolic Panel: Recent Labs  Lab 07/28/19 1325 07/29/19 0240 07/30/19 0232 07/31/19 0249  NA 131* 132*  --  134*  K 4.4 4.0  --  4.3  CL 100 103  --  105  CO2 22 22  --  24  GLUCOSE 274* 253*  --  152*  BUN 26* 24*  --  18  CREATININE 1.64* 1.68* 1.59* 1.51*  CALCIUM 8.3* 7.9*  --  8.0*   Liver Function Tests: No results for input(s): AST, ALT, ALKPHOS, BILITOT, PROT, ALBUMIN in the last 168 hours. No results for input(s): LIPASE, AMYLASE in the last 168 hours. No results for input(s): AMMONIA in the last 168 hours. CBC: Recent Labs  Lab 07/28/19 1325 07/29/19 0240 07/31/19 0249  WBC 14.1* 9.8 12.0*  NEUTROABS 9.7*  --  8.1*  HGB 11.1* 10.7* 10.5*  HCT 32.8* 32.6* 32.1*  MCV 79.8* 81.3 81.7  PLT 285 292 370   Cardiac Enzymes: No results for input(s): CKTOTAL, CKMB, CKMBINDEX, TROPONINI in the  last 168 hours. BNP: BNP (last 3 results) Recent Labs    07/23/19 1112  BNP 219.4*    ProBNP (last 3 results) No results for input(s): PROBNP in the last 8760 hours.  CBG: Recent Labs  Lab 07/31/19 0726 07/31/19 1128 07/31/19 1600 07/31/19 2206 08/01/19 0708  GLUCAP 110* 197* 173* 153* 168*       Signed:  Rhetta Mura MD   [facility] 08/01/2019, 9:41 AM

## 2019-08-02 ENCOUNTER — Encounter (HOSPITAL_COMMUNITY): Payer: Self-pay | Admitting: Orthopaedic Surgery

## 2019-08-02 LAB — CULTURE, BLOOD (ROUTINE X 2)
Culture: NO GROWTH
Special Requests: ADEQUATE

## 2019-08-02 MED FILL — AMLODIPINE BESYLATE 10 MG T: 10 | 30 days supply | Qty: 30 | Fill #0

## 2019-08-02 MED FILL — !LANTUS 100 UNITS/ML VIAL: 100 | 55 days supply | Qty: 10 | Fill #0

## 2019-08-03 LAB — AEROBIC/ANAEROBIC CULTURE W GRAM STAIN (SURGICAL/DEEP WOUND)

## 2020-02-29 IMAGING — CR LEFT FOOT - COMPLETE 3+ VIEW
3 series · 3 of 3 positions shown · non-contrast
Comparison: None.

CLINICAL DATA: Amputation earlier this year. Diabetes. Question
second toe osteomyelitis.

EXAM:
LEFT FOOT - COMPLETE 3+ VIEW

[x foot ap left]
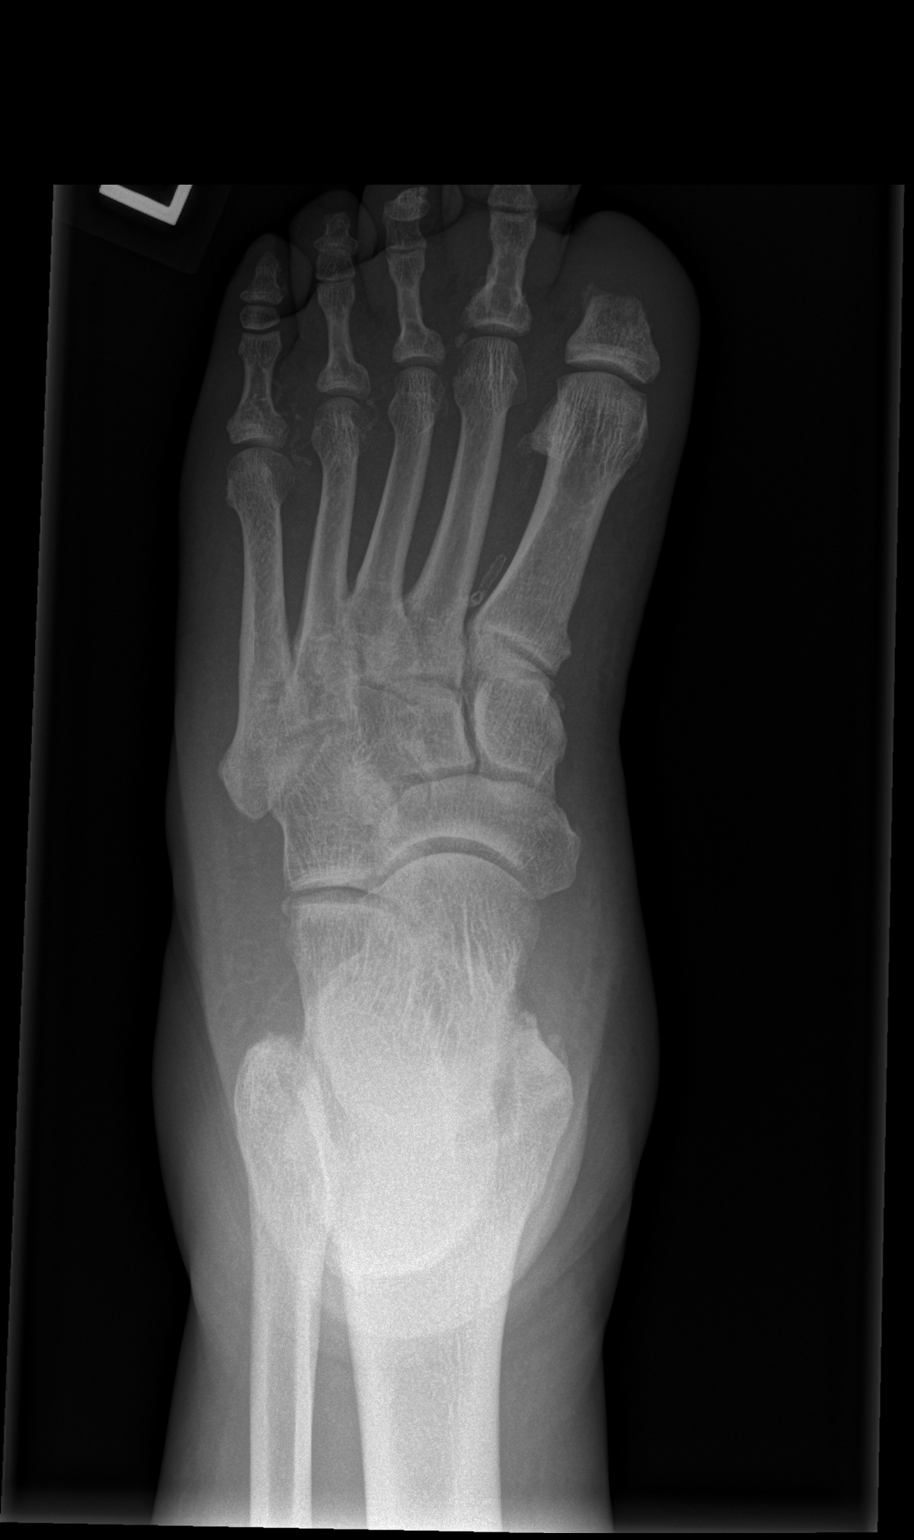

[x foot obl left]
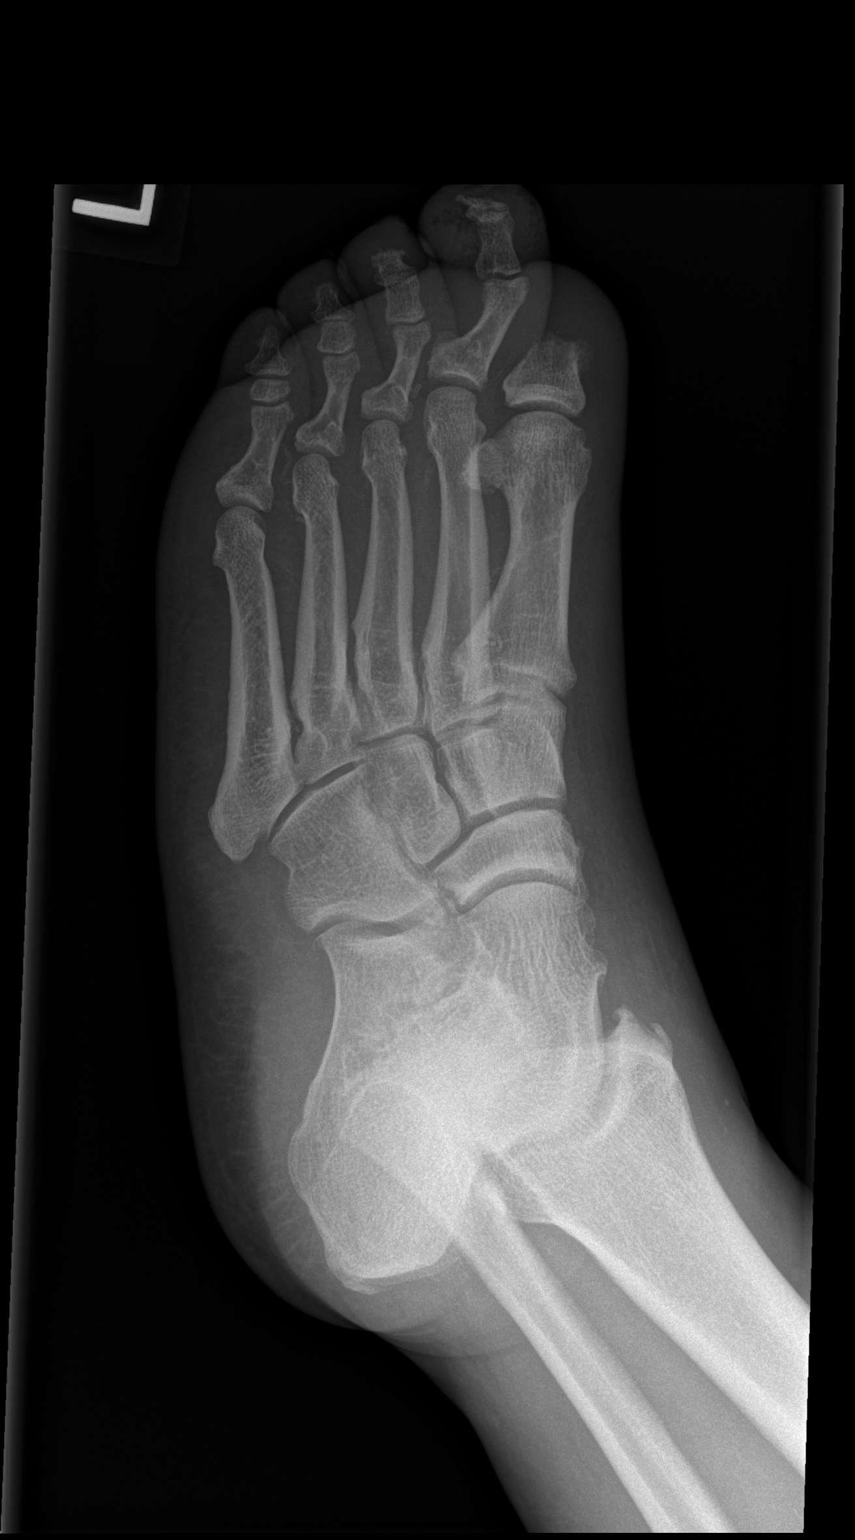

[x foot lat left]
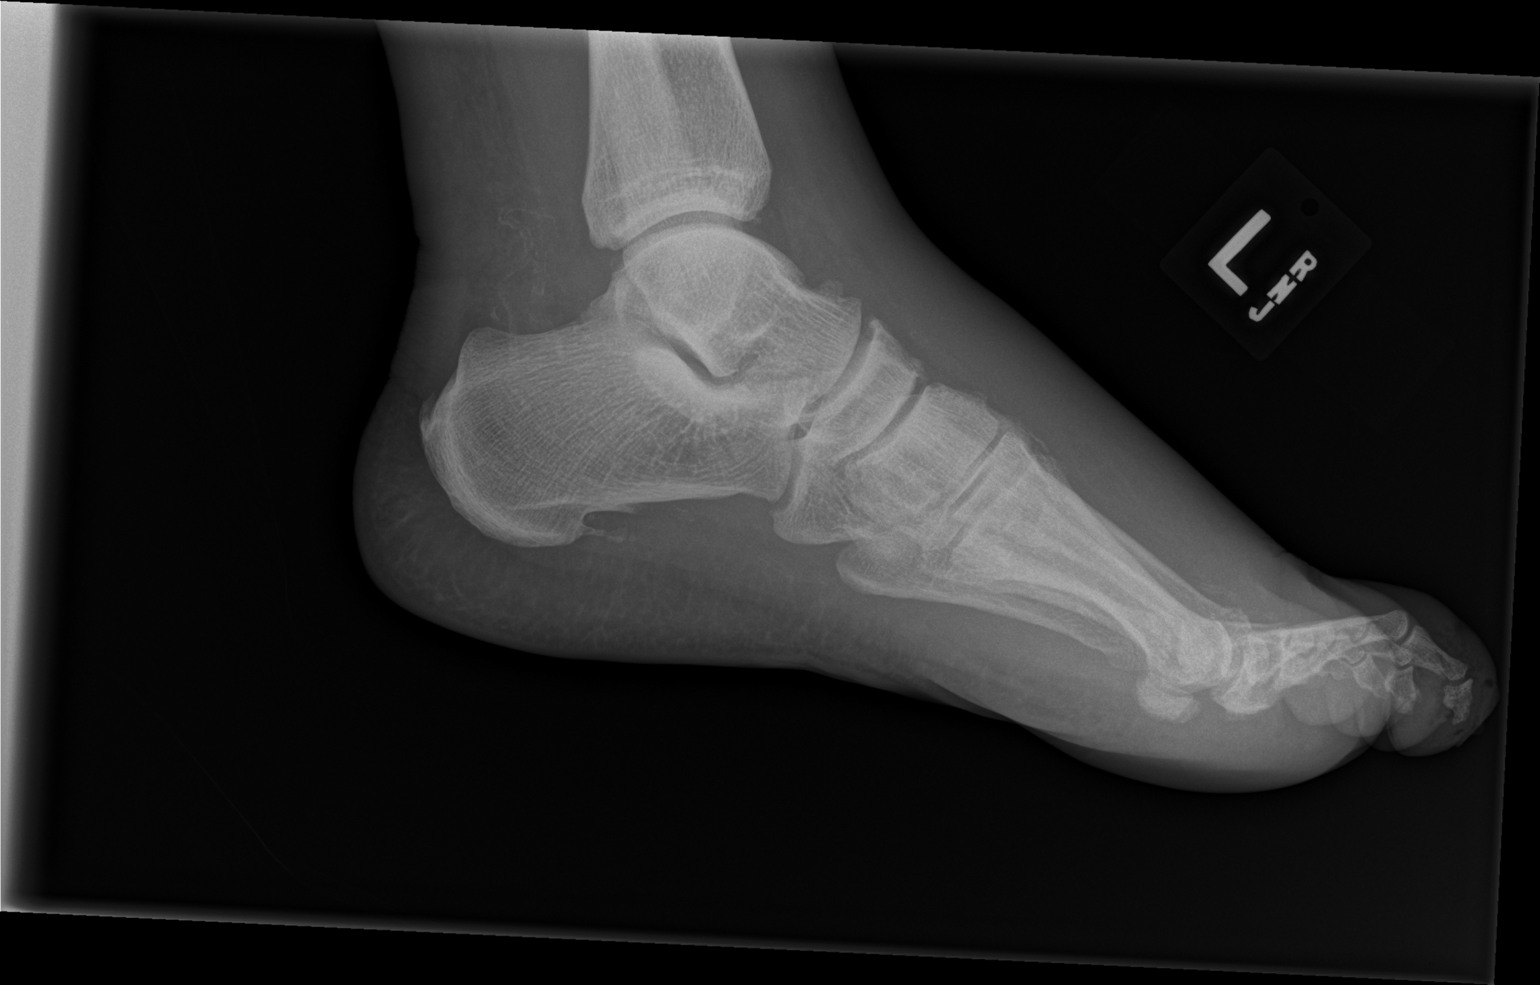

[3 of 3 positions shown; findings below may reference images not displayed]

FINDINGS: Previous partial amputation of the great toe with residual proximal
phalanx. That bone is slightly irregular and indeterminate for
residual infection. There is air/gas in the soft tissues of the
second toe with erosion of the distal phalanx consistent with
osteomyelitis. Extensive regional arterial calcification is noted.
Generalized soft tissue swelling of the foot.
IMPRESSION: Osteomyelitis of the second toe with partial erosion of the distal
phalanx.

Previous amputation of the great toe. Distal bone margin of the
phalanx is somewhat irregular which could be normal or reflect
residual or recurrent infection in that bone.
